# Patient Record
Sex: Male | Born: 1970 | Race: Asian | Hispanic: No | Marital: Single | Smoking: Former smoker
Health system: Southern US, Community
[De-identification: ages and names within clinical notes are randomized; demographics above are authoritative.]

## PROBLEM LIST (undated history)

## (undated) DIAGNOSIS — H9192 Unspecified hearing loss, left ear: Secondary | ICD-10-CM

## (undated) HISTORY — PX: NO PAST SURGERIES: SHX2092

---

## 2002-04-12 ENCOUNTER — Encounter: Payer: Self-pay | Admitting: Specialist

## 2002-04-12 ENCOUNTER — Encounter: Admission: RE | Admit: 2002-04-12 | Discharge: 2002-04-12 | Payer: Self-pay | Admitting: Specialist

## 2007-02-09 ENCOUNTER — Emergency Department (HOSPITAL_COMMUNITY): Admission: EM | Admit: 2007-02-09 | Discharge: 2007-02-09 | Payer: Self-pay | Admitting: Emergency Medicine

## 2011-06-19 ENCOUNTER — Ambulatory Visit: Payer: Self-pay | Admitting: Family Medicine

## 2011-06-19 VITALS — BP 133/90 | HR 89 | Temp 98.5°F | Resp 16 | Ht 62.0 in | Wt 125.8 lb

## 2011-06-19 DIAGNOSIS — H66009 Acute suppurative otitis media without spontaneous rupture of ear drum, unspecified ear: Secondary | ICD-10-CM

## 2011-06-19 DIAGNOSIS — H6690 Otitis media, unspecified, unspecified ear: Secondary | ICD-10-CM

## 2011-06-19 MED ORDER — AMOXICILLIN 500 MG PO CAPS: 1000.0000 mg | ORAL_CAPSULE | Freq: Two times a day (BID) | ORAL | Status: AC

## 2011-06-19 NOTE — Progress Notes (Signed)
  Patient Name: Joshua Goodman Date of Birth: 1970/09/09 Medical Record Number: [id number] Gender: male Date of Encounter: 06/19/2011  History of Present Illness:  Joshua Goodman is a 41 y.o. very pleasant male patient who presents with the following:  Here to evaluate left ear pain which has been intermittent for 10 YEARS.  He has noted "water" draining from the ear since last night.  He was told that his left TM ruptured several years ago but as far as he knows it has healed.  His hearing is decreased on the left.    otherwise he is feeling well and has no other symptoms.  He has not seen an ENT to evaluate his ear problem.  There is no problem list on file for this patient.  No past medical history on file. No past surgical history on file. History  Substance Use Topics  . Smoking status: Current Some Day Smoker  . Smokeless tobacco: Not on file  . Alcohol Use: Not on file   No family history on file. No Known Allergies  Medication list has been reviewed and updated.  Review of Systems: As per HPI- otherwise negative.   Physical Examination: Filed Vitals:   06/19/11 1325  BP: 133/90  Pulse: 89  Temp: 98.5 F (36.9 C)  Resp: 16  Height: 5\' 2"  (1.575 m)  Weight: 125 lb 12.8 oz (57.063 kg)    Body mass index is 23.01 kg/(m^2).  GEN: WDWN, NAD, Non-toxic, A & O x 3 HEENT: Atraumatic, Normocephalic. Neck supple. No masses, No LAD.  Right IAC/ TM wnl.  Left TM has a large hole, and the ear canal appears red but there is no swelling or debris as usually seen with OE.   Oropharynx wnl.  Ears and Nose: No external deformity. CV: RRR, No M/G/R. No JVD. No thrill. No extra heart sounds. PULM: CTA B, no wheezes, crackles, rhonchi. No retractions. No resp. distress. No accessory muscle use. EXTR: No c/c/e NEURO Normal gait.  PSYCH: Normally interactive. Conversant. Not depressed or anxious appearing.  Calm demeanor.    Assessment and Plan: 1. OM (otitis media), recurrent   amoxicillin (AMOXIL) 500 MG capsule   Suspect that he is having OM, as the ear canal is red but otherwise does not have a typical OE appearance.  However, if he is not feeling better within a couple of days he can call and I will call in floxin otic.  He is interested in getting his TM repaired and I gave him the name and phone number of several local ENT offices.

## 2011-07-04 ENCOUNTER — Telehealth: Payer: Self-pay

## 2011-07-04 DIAGNOSIS — H6060 Unspecified chronic otitis externa, unspecified ear: Secondary | ICD-10-CM

## 2011-07-04 NOTE — Telephone Encounter (Signed)
Pt would like something else for his ear infection she says he is not better

## 2011-07-16 ENCOUNTER — Telehealth: Payer: Self-pay | Admitting: *Deleted

## 2011-07-16 MED ORDER — OFLOXACIN 0.3 % OT SOLN: 10.0000 [drp] | Freq: Every day | OTIC | Status: AC

## 2011-07-16 NOTE — Telephone Encounter (Signed)
Pt states that the ear medication that Dr.Copland prescribed is not working pt would like something else prescribed if possible, has been waiting for several days for a call back.

## 2011-07-18 NOTE — Telephone Encounter (Signed)
Please call and see how his ear drops are doing (he was rx floxin otic a couple of days ago).  Thanks!

## 2011-07-18 NOTE — Telephone Encounter (Signed)
LMOM on Cell phone that we are checking on him and how the new otic sol is working for his Sxs. Asked for CB if no improvement or if he has any ?s/concerns

## 2013-06-03 ENCOUNTER — Ambulatory Visit: Payer: 59 | Admitting: Internal Medicine

## 2013-06-03 ENCOUNTER — Other Ambulatory Visit: Payer: Self-pay | Admitting: Internal Medicine

## 2013-06-03 ENCOUNTER — Ambulatory Visit: Payer: 59

## 2013-06-03 VITALS — BP 124/86 | HR 78 | Temp 98.4°F | Resp 16 | Ht 62.75 in | Wt 127.0 lb

## 2013-06-03 DIAGNOSIS — M79671 Pain in right foot: Secondary | ICD-10-CM

## 2013-06-03 DIAGNOSIS — M109 Gout, unspecified: Secondary | ICD-10-CM

## 2013-06-03 DIAGNOSIS — M79609 Pain in unspecified limb: Secondary | ICD-10-CM

## 2013-06-03 LAB — POCT CBC
Granulocyte percent: 68.2 %G (ref 37–80)
HCT, POC: 46.8 % (ref 43.5–53.7)
Hemoglobin: 15.1 g/dL (ref 14.1–18.1)
Lymph, poc: 1.8 (ref 0.6–3.4)
MCH, POC: 27.7 pg (ref 27–31.2)
MCHC: 32.3 g/dL (ref 31.8–35.4)
MCV: 85.8 fL (ref 80–97)
MID (cbc): 0.4 (ref 0–0.9)
MPV: 8 fL (ref 0–99.8)
POC Granulocyte: 4.8 (ref 2–6.9)
POC LYMPH PERCENT: 26.2 %L (ref 10–50)
POC MID %: 5.6 %M (ref 0–12)
Platelet Count, POC: 291 10*3/uL (ref 142–424)
RBC: 5.46 M/uL (ref 4.69–6.13)
RDW, POC: 12.9 %
WBC: 7 10*3/uL (ref 4.6–10.2)

## 2013-06-03 LAB — URIC ACID: Uric Acid, Serum: 7 mg/dL (ref 4.0–7.8)

## 2013-06-03 MED ORDER — INDOMETHACIN 50 MG PO CAPS: 50.0000 mg | ORAL_CAPSULE | Freq: Three times a day (TID) | ORAL | Status: DC

## 2013-06-03 NOTE — Progress Notes (Signed)
   Subjective:    Patient ID: Joshua NormanAjen Sellin, male    DOB: Jun 06, 1970, 43 y.o.   MRN: [id number]  HPI 43 year old male complains of left foot pain. He woke up Sunday morning and it has been swollen and painful since then. Has had same pain, reddness, swelling 1st mtpj left before. Does not speak English clearly. No fever, no health insurance.    Review of Systems Probable past hx of gout, has gone to providers out of our net work.     Objective:   Physical Exam  Constitutional: He is oriented to person, place, and time. He appears well-developed and well-nourished.  HENT:  Head: Normocephalic.  Eyes: EOM are normal.  Pulmonary/Chest: Effort normal.  Musculoskeletal: He exhibits edema and tenderness.       Left foot: He exhibits decreased range of motion, tenderness, bony tenderness, swelling and crepitus. He exhibits normal capillary refill, no deformity and no laceration.       Feet:  Red warm and tender  No wound Skin intact  Neurological: He is alert and oriented to person, place, and time. He exhibits normal muscle tone. Coordination normal.  Psychiatric: He has a normal mood and affect.   [facility] reading (PRIMARY) by  Dr.Abyan Cadman early punched out lesions medial joint space  No results found for this or any previous visit.  Results for orders placed in visit on 06/03/13  POCT CBC      Result Value Ref Range   WBC 7.0  4.6 - 10.2 K/uL   Lymph, poc 1.8  0.6 - 3.4   POC LYMPH PERCENT 26.2  10 - 50 %L   MID (cbc) 0.4  0 - 0.9   POC MID % 5.6  0 - 12 %M   POC Granulocyte 4.8  2 - 6.9   Granulocyte percent 68.2  37 - 80 %G   RBC 5.46  4.69 - 6.13 M/uL   Hemoglobin 15.1  14.1 - 18.1 g/dL   HCT, POC 24.446.8  01.043.5 - 53.7 %   MCV 85.8  80 - 97 fL   MCH, POC 27.7  27 - 31.2 pg   MCHC 32.3  31.8 - 35.4 g/dL   RDW, POC 27.212.9     Platelet Count, POC 291  142 - 424 K/uL   MPV 8.0  0 - 99.8 fL           Assessment & Plan:  Probable gout Post op shoe/Indocin 50mg  tid RTC 1 week  if not well

## 2013-06-03 NOTE — Patient Instructions (Signed)
Gout Gout is an inflammatory arthritis caused by a buildup of uric acid crystals in the joints. Uric acid is a chemical that is normally present in the blood. When the level of uric acid in the blood is too high it can form crystals that deposit in your joints and tissues. This causes joint redness, soreness, and swelling (inflammation). Repeat attacks are common. Over time, uric acid crystals can form into masses (tophi) near a joint, destroying bone and causing disfigurement. Gout is treatable and often preventable. CAUSES  The disease begins with elevated levels of uric acid in the blood. Uric acid is produced by your body when it breaks down a naturally found substance called purines. Certain foods you eat, such as meats and fish, contain high amounts of purines. Causes of an elevated uric acid level include:  Being passed down from parent to child (heredity).  Diseases that cause increased uric acid production (such as obesity, psoriasis, and certain cancers).  Excessive alcohol use.  Diet, especially diets rich in meat and seafood.  Medicines, including certain cancer-fighting medicines (chemotherapy), water pills (diuretics), and aspirin.  Chronic kidney disease. The kidneys are no longer able to remove uric acid well.  Problems with metabolism. Conditions strongly associated with gout include:  Obesity.  High blood pressure.  High cholesterol.  Diabetes. Not everyone with elevated uric acid levels gets gout. It is not understood why some people get gout and others do not. Surgery, joint injury, and eating too much of certain foods are some of the factors that can lead to gout attacks. SYMPTOMS   An attack of gout comes on quickly. It causes intense pain with redness, swelling, and warmth in a joint.  Fever can occur.  Often, only one joint is involved. Certain joints are more commonly involved:  Base of the big toe.  Knee.  Ankle.  Wrist.  Finger. Without  treatment, an attack usually goes away in a few days to weeks. Between attacks, you usually will not have symptoms, which is different from many other forms of arthritis. DIAGNOSIS  Your caregiver will suspect gout based on your symptoms and exam. In some cases, tests may be recommended. The tests may include:  Blood tests.  Urine tests.  X-rays.  Joint fluid exam. This exam requires a needle to remove fluid from the joint (arthrocentesis). Using a microscope, gout is confirmed when uric acid crystals are seen in the joint fluid. TREATMENT  There are two phases to gout treatment: treating the sudden onset (acute) attack and preventing attacks (prophylaxis).  Treatment of an Acute Attack.  Medicines are used. These include anti-inflammatory medicines or steroid medicines.  An injection of steroid medicine into the affected joint is sometimes necessary.  The painful joint is rested. Movement can worsen the arthritis.  You may use warm or cold treatments on painful joints, depending which works best for you.  Treatment to Prevent Attacks.  If you suffer from frequent gout attacks, your caregiver may advise preventive medicine. These medicines are started after the acute attack subsides. These medicines either help your kidneys eliminate uric acid from your body or decrease your uric acid production. You may need to stay on these medicines for a very long time.  The early phase of treatment with preventive medicine can be associated with an increase in acute gout attacks. For this reason, during the first few months of treatment, your caregiver may also advise you to take medicines usually used for acute gout treatment. Be sure you   understand your caregiver's directions. Your caregiver may make several adjustments to your medicine dose before these medicines are effective.  Discuss dietary treatment with your caregiver or dietitian. Alcohol and drinks high in sugar and fructose and foods  such as meat, poultry, and seafood can increase uric acid levels. Your caregiver or dietician can advise you on drinks and foods that should be limited. HOME CARE INSTRUCTIONS   Do not take aspirin to relieve pain. This raises uric acid levels.  Only take over-the-counter or prescription medicines for pain, discomfort, or fever as directed by your caregiver.  Rest the joint as much as possible. When in bed, keep sheets and blankets off painful areas.  Keep the affected joint raised (elevated).  Apply warm or cold treatments to painful joints. Use of warm or cold treatments depends on which works best for you.  Use crutches if the painful joint is in your leg.  Drink enough fluids to keep your urine clear or pale yellow. This helps your body get rid of uric acid. Limit alcohol, sugary drinks, and fructose drinks.  Follow your dietary instructions. Pay careful attention to the amount of protein you eat. Your daily diet should emphasize fruits, vegetables, whole grains, and fat-free or low-fat milk products. Discuss the use of coffee, vitamin C, and cherries with your caregiver or dietician. These may be helpful in lowering uric acid levels.  Maintain a healthy body weight. SEEK MEDICAL CARE IF:   You develop diarrhea, vomiting, or any side effects from medicines.  You do not feel better in 24 hours, or you are getting worse. SEEK IMMEDIATE MEDICAL CARE IF:   Your joint becomes suddenly more tender, and you have chills or a fever. MAKE SURE YOU:   Understand these instructions.  Will watch your condition.  Will get help right away if you are not doing well or get worse. Document Released: 01/19/2000 Document Revised: 05/18/2012 Document Reviewed: 09/04/2011 [facility] Patient Information 2014 [place]ExitCare, MarylandLLC. B?nh gt (Gout) Gt l tnh tr?ng vim kh?p gy ra b?i s? tch t? tinh th? axit uric trong kh?p. Axit uric l m?t ch?t ha h?c th??ng hi?n di?n trong mu. Khi n?ng ?? axit uric  trong mu qu cao, n c th? t?o [place] tin[place] th? tch t? trong kh?p v m c?a b?n. ?i?u ny gy ra t?y ??, ?au nh?c v s?ng kh?p (vim). Cc c?n gt l?p l?i l ph? bi?n. Theo th?i gian, cc tinh th? axit uric c th? t?o thnh cc c?c (h?t tophi) g?n kh?p, ph h?y x??ng v gy bi?n d?ng. Gt c th? ?i?u tr? ???c v th??ng c th? ng?n ng?a ???c.  NGUYN NHN C?n b?nh ny b?t ??u v?i n?ng ?? axit uric trong mu t?ng. Axit uric ???c t?o ra b?i c? th? khi ph v? m?t ch?t ???c tm th?y trong t? nhin c tn l purin. M?t s? lo?i th?c ph?m nh?t ??nh m b?n ?n, ch?ng h?n nh? th?t v c, c ch?a m?t l??ng l?n ch?t purin. Nguyn nhn lm cho n?ng ?? axit uric cao bao g?m:  Truy?n t? cha m? sang con (di truy?n).  B?nh lm t?ng s?n sinh axit uric (ch?ng h?n nh? bo ph, b?nh v?y n?n v m?t s? b?nh ung th? nh?t ??nh).  S? d?ng r??u qu m?c.  Ch? ?? ?n, ??c bi?t l ch? ?? ?n nhi?u th?t v h?i s?n.  Thu?c, bao g?m m?t s? lo?i thu?c ch?ng ung th? nh?t ??nh (ha tr? li?u), thu?c l?i ti?u v  aspirin.  B?nh th?n m?n tnh. Th?n khng th? lo?i b? h?t axit uric ???c n?a.  V?n ?? v? chuy?n ha. Cc tnh tr?ng c lin quan ch?t ch? v?i b?nh gt bao g?m:  Bo ph.  Huy?t p cao.  Cholesterol cao.  Ti?u ???ng. Khng ph?i t?t c? m?i ng??i c n?ng ?? axit uric cao ??u b? b?nh gt. Khng th? gi?i thch v sao m?t s? ng??i b? gt cn nh?ng ng??i khc l?i khng b?. Ph?u thu?t, ch?n th??ng kh?p v ?n qu nhi?u m?t s? lo?i th?c ph?m nh?t ??nh l m?t s? y?u t? c th? d?n ??n c?n gt c?p. TRI?U CH?NG  C?n gt c?p xu?t hi?n nhanh chng. B?nh gy ?au ??n d? d?i km theo t?y ??, s?ng v ?m ? m?t kh?p.  C th? b? s?t.  Thng th??ng, ch? l m?t kh?p b? ?au. M?t s? kh?p nh?t ??nh th??ng b? ?au:  N?n ngn chn ci.  ??u g?i.  C? chn.  C? tay.  Ngn tay. N?u khng ?i?u tr?, c?n gt c?p th??ng bi?n m?t trong m?t vi ngy ??n vi tu?n. Gi?a cc c?n gt c?p, b?n th??ng s? khng c cc tri?u ch?ng, cc tri?u ch?ng  th??ng khc v?i cc d?ng vim kh?p khc. CH?N ?ON Chuyn gia ch?m [place] s?c kh?e s? nghi ng? b?nh gt d?a trn cc tri?u ch?ng v vi?c khm. Trong m?t s? tr??ng h?p, b?n c th? c?n xt nghi?m. Cc xt nghi?m c th? bao g?m:  Xt nghi?m mu.  Xt nghi?m n??c ti?u.  Ch?p X quang.  Xt nghi?m d?ch kh?p. Xt nghi?m ny c?n m?t cy kim ?? ht d?ch ra kh?i kh?p (ch?c kh?p). B?ng cch s? d?ng knh hi?n vi, b?nh gt ???c xc nh?n khi tinh th? axit uric ???c pht hi?n trong d?ch kh?p. ?I?U TR? C hai giai ?o?n ?i?u tr? b?nh gt: ?i?u tr? c?n gt c?p kh?i pht ??t ng?t (c?p tnh) v ng?n ch?n c?n gt c?p (d? phng).  ?i?u Tr? C?n Gt C?p.  Thu?c ???c s? d?ng. Cc lo?i thu?c ny bao g?m thu?c ch?ng vim ho?c thu?c steroid.  ?i khi c?n tim thu?c steroid vo kh?p b? ?au.  Kh?p ?au ???c ngh? ng?i. V?n ??ng c th? lm tr?m tr?ng thm b?nh vim kh?p.  B?n c th? s? d?ng ph??ng php ?i?u tr? ?m ho?c l?nh trn kh?p b? ?au, ty thu?c ph??ng php no ph h?p nh?t v?i b?n.  ?i?u Tr? ?? Ng?n Ch?n Cc C?n Gt C?p.  N?u b?n th??ng xuyn b? cc c?n gt c?p, chuyn gia ch?m [place] s?c kh?e c th? t? v?n cho b?n s? d?ng thu?c d? phng. Nh?ng thu?c ny ???c b?t ??u sau khi c?n gt c?p ? ??. Nh?ng lo?i thu?c ny c gip th?n lo?i b? axit uric ra kh?i c? th? c?a b?n ho?c gi?m s?n xu?t axit uric. B?n c th? c?n ti?p t?c s? d?ng cc lo?i thu?c ny trong m?t th?i gian r?t di.  Giai ?o?n ?i?u tr? ban ??u b?ng thu?c phng ng?a c th? k?t h?p v?i s? gia t?ng cc c?n gt c?p. V l do ny, trong nh?ng thng ??u ?i?u tr?, chuyn gia ch?m [place] s?c kh?e c?ng c th? t? v?n cho b?n dng cc lo?i thu?c th??ng ???c s? d?ng ?? ?i?u tr? b?nh gt c?p tnh. ??m b?o b?n hi?u h??ng d?n c?a chuyn gia ch?m [place] s?c kh?e. Chuyn gia ch?m  s?c kh?e c th? th?c hi?n m?t s? ?i?u ch?nh li?u thu?c  c?a b?n tr??c khi nh?ng thu?c ny c hi?u qu?Marland Kitchen.  Th?o lu?n v? ?i?u tr? b?ng ch? ?? ?n u?ng v?i chuyn gia ch?m [place] s?c kh?e ho?c chuyn gia dinh  d??ng c?a b?n. R??u v ?? u?ng ch?a nhi?u ???ng v fructoza v cc th?c ph?m nh? th?t, gia c?m v h?i s?n c th? lm t?ng n?ng ?? axit uric. Chuyn gia ch?m [place] s?c kh?e ho?c chuyn gia dinh d??ng c?a b?n c th? t? v?n cho b?n v? ?? u?ng v th?c ph?m no c?n ph?i h?n ch?. H??NG D?N CH?M [place] T?I NH  Khng s? d?ng aspirin ?? gi?m ?au. Aspirin lm t?ng n?ng ?? axit uric.  Ch? s? d?ng thu?c khng c?n k toa ho?c thu?c c?n k toa ?? gi?m ?au, gi?m c?m gic kh ch?u ho?c h? s?t theo ch? d?n c?a chuyn gia ch?m [place] s?c kh?e c?a b?n.  ?? kh?p ? ngh? ng?i cng nhi?u cng t?t. Khi ? trn gi??ng, gi? cho kh?n tr?i gi??ng v ch?n trnh xa nh?ng ch? b? ?au.  Gi? cho kh?p b? ?au ? trn cao (gi? cao).  Ch??m ?m ho?c l?nh vo kh?p b? ?au. S? d?ng ph??ng php ?i?u tr? ?m ho?c l?nh ty thu?c vo ph??ng php no ph h?p nh?t v?i b?n.  S? d?ng n?ng n?u kh?p b? ?au ? chn b?n.  U?ng ?? n??c ?? gi? cho n??c ti?u trong ho?c vng nh?t. ?i?u ny gip c? th? b?n lo?i b? axit uric. H?n ch? r??u, ?? u?ng c ???ng v ?? u?ng c fructoza.  Th?c hi?n theo h??ng d?n v? ch? ?? ?n u?ng. Hy ch  c?n th?n ??n l??ng prtein b?n ?n. Ch? ?? ?n u?ng hng ngy c?a b?n nn t?p trung vo tri cy, rau, ng? c?c nguyn h?t v nh?ng s?n ph?m s?a khng c ch?t bo ho?c t ch?t bo. Th?o lu?n v? vi?c s? d?ng c ph, vitamin C v anh ?o v?i chuyn gia ch?m [place] s?c kh?e v chuyn gia dinh d??ng c?a b?n. Chng c th? gip lm gi?m n?ng ?? axit uric.  Duy tr tr?ng l??ng c? th? kh?e m?nh. HY ?I KHM N?U:  B?n b? tiu ch?y, nn m?a ho?c b?t k? tc d?ng ph? no t? thu?c.  B?n khng c?m th?y kh h?n sau 24 gi?, ho?c b?n th?y t? h?n. HY NGAY L?P T?C ?I KHM N?U:  Kh?p c?a b?n ??t nhin tr? nn nh?y c?m ?au h?n v b?n b? ?n l?nh ho?c s?t. ??M B?O B?N:  Hi?u cc h??ng d?n ny.  S? theo di tnh tr?ng c?a mnh.  S? yu c?u tr? gip ngay l?p t?c n?u b?n c?m th?y khng ?? ho?c tnh tr?ng tr?m tr?ng h?n. Document Released: 10/31/2004  Document Revised: 09/23/2012 [facility]Exit[facility] Patient Information 2014 [place] RidgeExitCare, MarylandLLC.

## 2013-06-07 ENCOUNTER — Encounter: Payer: Self-pay | Admitting: *Deleted

## 2013-11-09 ENCOUNTER — Other Ambulatory Visit: Payer: Self-pay | Admitting: Otolaryngology

## 2013-11-16 ENCOUNTER — Ambulatory Visit (INDEPENDENT_AMBULATORY_CARE_PROVIDER_SITE_OTHER): Payer: 59 | Admitting: Family Medicine

## 2013-11-16 VITALS — BP 136/80 | HR 84 | Temp 98.5°F | Resp 16 | Ht 62.5 in | Wt 129.0 lb

## 2013-11-16 DIAGNOSIS — H9192 Unspecified hearing loss, left ear: Secondary | ICD-10-CM

## 2013-11-16 DIAGNOSIS — H9202 Otalgia, left ear: Secondary | ICD-10-CM

## 2013-11-16 DIAGNOSIS — H65492 Other chronic nonsuppurative otitis media, left ear: Secondary | ICD-10-CM

## 2013-11-16 MED ORDER — AMOXICILLIN 875 MG PO TABS: 875.0000 mg | ORAL_TABLET | Freq: Two times a day (BID) | ORAL | Status: DC

## 2013-11-16 NOTE — Progress Notes (Signed)
This chart was scribed for Elvina SidleKurt Paizley Ramella, MD by Marica OtterNusrat Rahman, ED Scribe at [facility]. This patient was seen in room Room 8 and the patient's care was started at 10:31 AM.  Patient ID: Joshua Goodman MRN: [id number], DOB: [birth year], 43 y.o. Date of Encounter: 11/16/2013, 10:31 AM  Primary Physician: Lucilla EdinAUB, STEVE A, MD  Chief Complaint  Patient presents with  . Otalgia    left ear pain for awhile; clear drainage coming from ear  . Referral    pt would like to have a referral to [facility]   HPI: 43 y.o. year old male with history below presents with left ear pain with clear drainage onset onset 5 days ago. Pt has a Hx of recurrent ear problems. Pain is associated with hearing loss but no dizziness. Pt reports he was previously seen by ENT and wants a referral to see the ENT again.    History reviewed. No pertinent past medical history.   Home Meds: Prior to Admission medications   Not on File    Allergies: No Known Allergies  History   Social History  . Marital Status: Single    Spouse Name: N/A    Number of Children: N/A  . Years of Education: N/A   Occupational History  . Not on file.   Social History Main Topics  . Smoking status: Former Games developermoker  . Smokeless tobacco: Not on file  . Alcohol Use: Yes     Comment: 2 times yearly  . Drug Use: No  . Sexual Activity: Not on file   Other Topics Concern  . Not on file   Social History Narrative  . No narrative on file     Review of Systems: Constitutional: negative for chills, fever, night sweats, weight changes, or fatigue  HEENT: negative for vision changes, hearing loss, congestion, rhinorrhea, ST, epistaxis, or sinus pressure. Positive for L ear pain and associated clear drainage. Cardiovascular: negative for chest pain or palpitations Respiratory: negative for hemoptysis, wheezing, shortness of breath, or cough Abdominal: negative for abdominal pain, nausea, vomiting, diarrhea, or  constipation Dermatological: negative for rash Neurologic: negative for headache, dizziness, or syncope All other systems reviewed and are otherwise negative with the exception to those above and in the HPI.   Physical Exam: Blood pressure 136/80, pulse 84, temperature 98.5 F (36.9 C), resp. rate 16, height 5' 2.5" (1.588 m), weight 129 lb (58.514 kg), SpO2 97.00%., Body mass index is 23.2 kg/(m^2). General: Well developed, well nourished, in no acute distress. Head: Normocephalic, atraumatic, eyes without discharge, sclera non-icteric, nares are without discharge. Right auditory canal: clear, TM's are without perforation, pearly grey and translucent with reflective cone of light bilaterally.  Left ear drum is retracted with amber colored fluid. Oral cavity moist, posterior pharynx without exudate, erythema, peritonsillar abscess, or post nasal drip. Neck: Supple. No thyromegaly. Full ROM. No lymphadenopathy. Lungs: Clear bilaterally to auscultation without wheezes, rales, or rhonchi. Breathing is unlabored. Heart: RRR with S1 S2. No murmurs, rubs, or gallops appreciated. Abdomen: Soft, non-tender, non-distended with normoactive bowel sounds. No hepatomegaly. No rebound/guarding. No obvious abdominal masses. Msk:  Strength and tone normal for age. Extremities/Skin: Warm and dry. No clubbing or cyanosis. No edema. No rashes or suspicious lesions. Neuro: Alert and oriented X 3. Moves all extremities spontaneously. Gait is normal. CNII-XII grossly in tact. Psych:  Responds to questions appropriately with a normal affect.        ASSESSMENT AND PLAN:  DIAGNOSTIC  STUDIES: Oxygen Saturation is 97% on RA, nl by my interpretation.    COORDINATION OF CARE: 10:35 AM-Discussed treatment plan which includes ENT referral and amoxacillin with pt at bedside and pt agreed to plan.   43 y.o. 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 The recorded information has been reviewed and is accurate.

## 2013-11-18 ENCOUNTER — Encounter (HOSPITAL_BASED_OUTPATIENT_CLINIC_OR_DEPARTMENT_OTHER): Payer: Self-pay | Admitting: *Deleted

## 2013-11-18 NOTE — Progress Notes (Signed)
Pt speaks very good english-no pcp-never been sedated

## 2013-11-22 ENCOUNTER — Encounter (HOSPITAL_BASED_OUTPATIENT_CLINIC_OR_DEPARTMENT_OTHER): Payer: Self-pay

## 2013-11-22 ENCOUNTER — Ambulatory Visit (HOSPITAL_BASED_OUTPATIENT_CLINIC_OR_DEPARTMENT_OTHER): Payer: 59 | Admitting: Anesthesiology

## 2013-11-22 ENCOUNTER — Encounter (HOSPITAL_BASED_OUTPATIENT_CLINIC_OR_DEPARTMENT_OTHER)
Admission: RE | Disposition: A | Discharge status: Home / Self Care | Payer: Self-pay | Source: Ambulatory Visit | Attending: Otolaryngology

## 2013-11-22 ENCOUNTER — Ambulatory Visit (HOSPITAL_BASED_OUTPATIENT_CLINIC_OR_DEPARTMENT_OTHER)
Admission: RE | Admit: 2013-11-22 | Discharge: 2013-11-22 | Disposition: A | Discharge status: Home / Self Care | Payer: 59 | Source: Ambulatory Visit | Attending: Otolaryngology | Admitting: Otolaryngology

## 2013-11-22 ENCOUNTER — Encounter (HOSPITAL_BASED_OUTPATIENT_CLINIC_OR_DEPARTMENT_OTHER): Payer: 59 | Admitting: Anesthesiology

## 2013-11-22 DIAGNOSIS — H902 Conductive hearing loss, unspecified: Secondary | ICD-10-CM | POA: Diagnosis not present

## 2013-11-22 DIAGNOSIS — H7292 Unspecified perforation of tympanic membrane, left ear: Secondary | ICD-10-CM | POA: Insufficient documentation

## 2013-11-22 DIAGNOSIS — Z9889 Other specified postprocedural states: Secondary | ICD-10-CM

## 2013-11-22 HISTORY — PX: TYMPANOPLASTY: SHX33

## 2013-11-22 HISTORY — DX: Unspecified hearing loss, left ear: H91.92

## 2013-11-22 LAB — POCT HEMOGLOBIN-HEMACUE: HEMOGLOBIN: 16.7 g/dL (ref 13.0–17.0)

## 2013-11-22 SURGERY — TYMPANOPLASTY
Anesthesia: General | Site: Ear | Laterality: Left

## 2013-11-22 MED ORDER — MIDAZOLAM HCL 5 MG/5ML IJ SOLN
INTRAMUSCULAR | Status: DC | PRN
  Administered 2013-11-22: 2 mg via INTRAVENOUS

## 2013-11-22 MED ORDER — FENTANYL CITRATE 0.05 MG/ML IJ SOLN
INTRAMUSCULAR | Status: DC | PRN
  Administered 2013-11-22 (×2): 50 ug via INTRAVENOUS
  Administered 2013-11-22: 100 ug via INTRAVENOUS

## 2013-11-22 MED ORDER — CIPROFLOXACIN-DEXAMETHASONE 0.3-0.1 % OT SUSP
OTIC | Status: DC | PRN
  Administered 2013-11-22: 4 [drp] via OTIC

## 2013-11-22 MED ORDER — MIDAZOLAM HCL 2 MG/2ML IJ SOLN: 1.0000 mg | INTRAMUSCULAR | Status: DC | PRN

## 2013-11-22 MED ORDER — MIDAZOLAM HCL 2 MG/2ML IJ SOLN
INTRAMUSCULAR | Status: AC
  Filled 2013-11-22: qty 2

## 2013-11-22 MED ORDER — CEFAZOLIN SODIUM-DEXTROSE 2-3 GM-% IV SOLR
INTRAVENOUS | Status: DC | PRN
  Administered 2013-11-22: 2 g via INTRAVENOUS

## 2013-11-22 MED ORDER — LIDOCAINE-EPINEPHRINE 1 %-1:100000 IJ SOLN
INTRAMUSCULAR | Status: DC | PRN
  Administered 2013-11-22: 2 mL

## 2013-11-22 MED ORDER — FENTANYL CITRATE 0.05 MG/ML IJ SOLN: 50.0000 ug | INTRAMUSCULAR | Status: DC | PRN

## 2013-11-22 MED ORDER — CIPROFLOXACIN-DEXAMETHASONE 0.3-0.1 % OT SUSP
OTIC | Status: AC
  Filled 2013-11-22: qty 7.5

## 2013-11-22 MED ORDER — PROPOFOL 10 MG/ML IV BOLUS
INTRAVENOUS | Status: DC | PRN
  Administered 2013-11-22: 150 mg via INTRAVENOUS
  Administered 2013-11-22: 20 mg via INTRAVENOUS

## 2013-11-22 MED ORDER — DIPHENHYDRAMINE HCL 50 MG/ML IJ SOLN
12.5000 mg | Freq: Once | INTRAMUSCULAR | Status: AC
  Administered 2013-11-22: 12.5 mg via INTRAVENOUS

## 2013-11-22 MED ORDER — AMOXICILLIN 875 MG PO TABS: 875.0000 mg | ORAL_TABLET | Freq: Two times a day (BID) | ORAL | Status: DC

## 2013-11-22 MED ORDER — ONDANSETRON HCL 4 MG/2ML IJ SOLN
INTRAMUSCULAR | Status: DC | PRN
  Administered 2013-11-22: 4 mg via INTRAVENOUS

## 2013-11-22 MED ORDER — OXYCODONE-ACETAMINOPHEN 5-325 MG PO TABS: 1.0000 | ORAL_TABLET | ORAL | Status: DC | PRN

## 2013-11-22 MED ORDER — EPINEPHRINE HCL 1 MG/ML IJ SOLN
INTRAMUSCULAR | Status: AC
  Filled 2013-11-22: qty 1

## 2013-11-22 MED ORDER — MIDAZOLAM HCL 2 MG/2ML IJ SOLN: 0.5000 mg | Freq: Once | INTRAMUSCULAR | Status: DC | PRN

## 2013-11-22 MED ORDER — OXYCODONE HCL 5 MG PO TABS: 5.0000 mg | ORAL_TABLET | Freq: Once | ORAL | Status: DC | PRN

## 2013-11-22 MED ORDER — FENTANYL CITRATE 0.05 MG/ML IJ SOLN
INTRAMUSCULAR | Status: AC
  Filled 2013-11-22: qty 8

## 2013-11-22 MED ORDER — LACTATED RINGERS IV SOLN
INTRAVENOUS | Status: DC
  Administered 2013-11-22 (×2): via INTRAVENOUS

## 2013-11-22 MED ORDER — SUCCINYLCHOLINE CHLORIDE 20 MG/ML IJ SOLN
INTRAMUSCULAR | Status: DC | PRN
  Administered 2013-11-22: 100 mg via INTRAVENOUS

## 2013-11-22 MED ORDER — BACITRACIN ZINC 500 UNIT/GM EX OINT
TOPICAL_OINTMENT | CUTANEOUS | Status: DC | PRN
  Administered 2013-11-22: 1 via TOPICAL

## 2013-11-22 MED ORDER — HYDROMORPHONE HCL 1 MG/ML IJ SOLN: 0.2500 mg | INTRAMUSCULAR | Status: DC | PRN

## 2013-11-22 MED ORDER — DEXAMETHASONE SODIUM PHOSPHATE 4 MG/ML IJ SOLN
INTRAMUSCULAR | Status: DC | PRN
  Administered 2013-11-22: 10 mg via INTRAVENOUS

## 2013-11-22 MED ORDER — PROPOFOL 10 MG/ML IV BOLUS
INTRAVENOUS | Status: AC
  Filled 2013-11-22: qty 20

## 2013-11-22 MED ORDER — LIDOCAINE-EPINEPHRINE 1 %-1:100000 IJ SOLN
INTRAMUSCULAR | Status: AC
  Filled 2013-11-22: qty 1

## 2013-11-22 MED ORDER — OXYCODONE HCL 5 MG/5ML PO SOLN: 5.0000 mg | Freq: Once | ORAL | Status: DC | PRN

## 2013-11-22 MED ORDER — OXYMETAZOLINE HCL 0.05 % NA SOLN
NASAL | Status: AC
  Filled 2013-11-22: qty 15

## 2013-11-22 MED ORDER — MEPERIDINE HCL 25 MG/ML IJ SOLN: 6.2500 mg | INTRAMUSCULAR | Status: DC | PRN

## 2013-11-22 MED ORDER — BACITRACIN ZINC 500 UNIT/GM EX OINT
TOPICAL_OINTMENT | CUTANEOUS | Status: AC
  Filled 2013-11-22: qty 28.35

## 2013-11-22 MED ORDER — PROMETHAZINE HCL 25 MG/ML IJ SOLN: 6.2500 mg | INTRAMUSCULAR | Status: DC | PRN

## 2013-11-22 SURGICAL SUPPLY — 53 items
ADH SKN CLS APL DERMABOND .7 (GAUZE/BANDAGES/DRESSINGS) ×1
BALL CTTN LRG ABS STRL LF (GAUZE/BANDAGES/DRESSINGS) ×1
BLADE CLIPPER SURG (BLADE) ×2 IMPLANT
BLADE NDL 3 SS STRL (BLADE) IMPLANT
BLADE NEEDLE 3 SS STRL (BLADE) IMPLANT
BLADE NEEDLE 3MM SS STRL (BLADE)
CANISTER SUCT 1200ML W/VALVE (MISCELLANEOUS) ×3 IMPLANT
CORDS BIPOLAR (ELECTRODE) IMPLANT
COTTONBALL LRG STERILE PKG (GAUZE/BANDAGES/DRESSINGS) ×3 IMPLANT
DECANTER SPIKE VIAL GLASS SM (MISCELLANEOUS) ×1 IMPLANT
DERMABOND ADVANCED (GAUZE/BANDAGES/DRESSINGS) ×2
DERMABOND ADVANCED .7 DNX12 (GAUZE/BANDAGES/DRESSINGS) IMPLANT
DRAPE MICROSCOPE WILD 40.5X102 (DRAPES) ×3 IMPLANT
DRAPE SURG 17X23 STRL (DRAPES) ×3 IMPLANT
DRSG GLASSCOCK MASTOID ADT (GAUZE/BANDAGES/DRESSINGS) IMPLANT
DRSG GLASSCOCK MASTOID PED (GAUZE/BANDAGES/DRESSINGS) IMPLANT
ELECT COATED BLADE 2.86 ST (ELECTRODE) ×3 IMPLANT
ELECT REM PT RETURN 9FT ADLT (ELECTROSURGICAL) ×3
ELECTRODE REM PT RTRN 9FT ADLT (ELECTROSURGICAL) ×1 IMPLANT
GAUZE SPONGE 4X4 12PLY STRL (GAUZE/BANDAGES/DRESSINGS) IMPLANT
GLOVE BIO SURGEON STRL SZ7.5 (GLOVE) ×3 IMPLANT
GLOVE BIOGEL M 7.0 STRL (GLOVE) ×2 IMPLANT
GLOVE BIOGEL PI IND STRL 7.5 (GLOVE) IMPLANT
GLOVE BIOGEL PI INDICATOR 7.5 (GLOVE) ×2
GOWN STRL REUS W/ TWL LRG LVL3 (GOWN DISPOSABLE) ×2 IMPLANT
GOWN STRL REUS W/TWL LRG LVL3 (GOWN DISPOSABLE) ×6
IV CATH AUTO 14GX1.75 SAFE ORG (IV SOLUTION) ×3 IMPLANT
IV NS 500ML (IV SOLUTION)
IV NS 500ML BAXH (IV SOLUTION) IMPLANT
NDL HYPO 25X1 1.5 SAFETY (NEEDLE) ×1 IMPLANT
NDL SAFETY ECLIPSE 18X1.5 (NEEDLE) ×1 IMPLANT
NEEDLE HYPO 18GX1.5 SHARP (NEEDLE) ×3
NEEDLE HYPO 25X1 1.5 SAFETY (NEEDLE) ×3 IMPLANT
NS IRRIG 1000ML POUR BTL (IV SOLUTION) ×3 IMPLANT
PACK BASIN DAY SURGERY FS (CUSTOM PROCEDURE TRAY) ×3 IMPLANT
PACK ENT DAY SURGERY (CUSTOM PROCEDURE TRAY) ×3 IMPLANT
PENCIL BUTTON HOLSTER BLD 10FT (ELECTRODE) ×3 IMPLANT
SET EXT MALE ROTATING LL 32IN (MISCELLANEOUS) ×3 IMPLANT
SET IV EXT TUBING FEMALE 31 (MISCELLANEOUS) ×1 IMPLANT
SLEEVE SCD COMPRESS KNEE MED (MISCELLANEOUS) ×2 IMPLANT
SPONGE GAUZE 4X4 12PLY STER LF (GAUZE/BANDAGES/DRESSINGS) IMPLANT
SPONGE SURGIFOAM ABS GEL 12-7 (HEMOSTASIS) ×3 IMPLANT
SUT VIC AB 3-0 SH 27 (SUTURE)
SUT VIC AB 3-0 SH 27X BRD (SUTURE) IMPLANT
SUT VIC AB 4-0 P-3 18XBRD (SUTURE) IMPLANT
SUT VIC AB 4-0 P3 18 (SUTURE)
SUT VICRYL 4-0 PS2 18IN ABS (SUTURE) ×2 IMPLANT
SYR 3ML 18GX1 1/2 (SYRINGE) ×3 IMPLANT
SYR 5ML LL (SYRINGE) IMPLANT
SYR BULB 3OZ (MISCELLANEOUS) IMPLANT
TOWEL OR 17X24 6PK STRL BLUE (TOWEL DISPOSABLE) ×3 IMPLANT
TRAY DSU PREP LF (CUSTOM PROCEDURE TRAY) ×3 IMPLANT
TUBING IRRIGATION (MISCELLANEOUS) IMPLANT

## 2013-11-22 NOTE — H&P (Signed)
Cc: Left TM perforation  HPI: The patient is a 43 y/o male who returns today for follow up evaluation of his left TM perforation. He was last seen 6 months ago.  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 According to the patient, he is doing much better. No otalgia, otorrhea, or dizziness has been noted. He feels that his hearing has improved. No other ENT, GI, or respiratory issue noted since the last visit.   Exam: General: Communicates without difficulty, well nourished, no acute distress. Head: Normocephalic, no evidence injury, no tenderness, facial buttresses intact without stepoff. Eyes: PERRL, EOMI. No scleral icterus, conjunctivae clear. Neuro: CN II exam reveals vision grossly intact. No nystagmus at any point of gaze. Ears: Auricles well formed without lesions. Ear canals are intact without mass or lesion. No erythema or edema is appreciated. The right TM is intact. The left is noted to have a large perforation without erythema or drainage. Nose: External evaluation reveals normal support and skin without lesions. Dorsum is intact. Anterior rhinoscopy reveals healthy pink mucosa over anterior aspect of inferior turbinates and intact septum. No purulence noted. Oral:  Oral cavity and oropharynx are intact, symmetric, without erythema or edema. Mucosa is moist without lesions. Neck: Full range of motion without pain. There is no significant lymphadenopathy. No masses palpable. Thyroid bed within normal limits to palpation. Parotid glands and submandibular glands equal bilaterally without mass. Trachea is midline. Neuro:  CN 2-12 grossly intact. Gait normal. Vestibular: No nystagmus at any point of gaze. The cerebellar examination is unremarkable.  AUDIOMETRIC TESTING:  Shows normal hearing on the right across all frequencies with mild conductive hearing loss noted on the left. The speech  reception threshold is 15dB AD and 40dB AS. The discrimination score is 88% AD and 92% AS. The tympanogram normal on the right.  Assessment 1. 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 2. The right TM is intact with normal hearing noted.  3. Mild conductive hearing loss is noted on the left.   Plan 1. The physical exam findings and hearing test results are reviewed with the patient.  2. Treatment options include continuing conservative observation with dry ear precautions verses tympanoplasty. The risks, benefits, alternatives, and details of the procedure are reviewed with the patient. Questions are invited and answered. 3. The patient would like to proceed with the tympanoplasty procedure.

## 2013-11-22 NOTE — Anesthesia Postprocedure Evaluation (Signed)
  Anesthesia Post-op Note  Patient: Joshua Goodman  Procedure(s) Performed: Procedure(s): LEFT TYMPANOPLASTY (Left)  Patient Location: PACU  Anesthesia Type:General  Level of Consciousness: awake and alert   Airway and Oxygen Therapy: Patient Spontanous Breathing  Post-op Pain: none  Post-op Assessment: Post-op Vital signs reviewed, Patient's Cardiovascular Status Stable and Respiratory Function Stable  Post-op Vital Signs: Reviewed  Filed Vitals:   11/22/13 1345  BP: 115/79  Pulse: 79  Temp:   Resp: 18    Complications: No apparent anesthesia complications

## 2013-11-22 NOTE — Anesthesia Preprocedure Evaluation (Addendum)
Anesthesia Evaluation  Patient identified by MRN, date of birth, ID band Patient awake    Reviewed: Allergy & Precautions, H&P , NPO status , Patient's Chart, lab work & pertinent test results  History of Anesthesia Complications Negative for: history of anesthetic complications  Airway Mallampati: II TM Distance: >3 FB Neck ROM: Full    Dental  (+) Loose, Dental Advisory Given   Pulmonary former smoker,  breath sounds clear to auscultation        Cardiovascular negative cardio ROS  Rhythm:Regular Rate:Normal     Neuro/Psych negative neurological ROS     GI/Hepatic negative GI ROS, Neg liver ROS,   Endo/Other  negative endocrine ROS  Renal/GU negative Renal ROS     Musculoskeletal   Abdominal   Peds  Hematology negative hematology ROS (+)   Anesthesia Other Findings   Reproductive/Obstetrics                         Anesthesia Physical Anesthesia Plan  ASA: II  Anesthesia Plan: General   Post-op Pain Management:    Induction: Intravenous  Airway Management Planned: Oral ETT  Additional Equipment:   Intra-op Plan:   Post-operative Plan: Extubation in OR  Informed Consent: I have reviewed the patients History and Physical, chart, labs and discussed the procedure including the risks, benefits and alternatives for the proposed anesthesia with the patient or authorized representative who has indicated his/her understanding and acceptance.   Dental advisory given  Plan Discussed with: CRNA and Surgeon  Anesthesia Plan Comments: (Plan routine monitors, GETA)        Anesthesia Quick Evaluation

## 2013-11-22 NOTE — Anesthesia Procedure Notes (Signed)
Procedure Name: Intubation Date/Time: 11/22/2013 11:21 AM Performed by: Gar GibbonKEETON, Ugochi Henzler S Pre-anesthesia Checklist: Patient identified, Emergency Drugs available, Suction available and Patient being monitored Patient Re-evaluated:Patient Re-evaluated prior to inductionOxygen Delivery Method: Circle System Utilized Preoxygenation: Pre-oxygenation with 100% oxygen Intubation Type: IV induction Ventilation: Mask ventilation without difficulty Laryngoscope Size: Miller and 2 Grade View: Grade II Tube type: Oral Tube size: 8.0 mm Number of attempts: 1 Airway Equipment and Method: stylet and oral airway Placement Confirmation: ETT inserted through vocal cords under direct vision,  positive ETCO2 and breath sounds checked- equal and bilateral Secured at: 19 cm Tube secured with: Tape Dental Injury: Teeth and Oropharynx as per pre-operative assessment

## 2013-11-22 NOTE — Brief Op Note (Signed)
11/22/2013  12:32 PM  PATIENT:  Joshua Goodman  43 y.o. male  PRE-OPERATIVE DIAGNOSIS:  LET TYMPANIC MEMBRANE PERFORATION   POST-OPERATIVE DIAGNOSIS:  LET TYMPANIC MEMBRANE PERFORATION   PROCEDURE:  Procedure(s): 1. LEFT TRANSCANAL TYMPANOPLASTY  2. POST AURICULAR TEMPORALIS FASCIA GRAFT  SURGEON:  Surgeon(s) and Role:    * Sui W Ioanna Colquhoun, MD - Primary  PHYSICIAN ASSISTANT:   ASSISTANTS: none   ANESTHESIA:   general  EBL:  Total I/O In: 1000 [I.V.:1000] Out: -   BLOOD ADMINISTERED:none  DRAINS: none   LOCAL MEDICATIONS USED:  LIDOCAINE   SPECIMEN:  No Specimen  DISPOSITION OF SPECIMEN:  N/A  COUNTS:  YES  TOURNIQUET:  * No tourniquets in log *  DICTATION: .Other Dictation: Dictation Number  [id number]  PLAN OF CARE: Discharge to home after PACU  PATIENT DISPOSITION:  PACU - hemodynamically stable.   Delay start of Pharmacological VTE agent (>24hrs) due to surgical blood loss or risk of bleeding: not applicable

## 2013-11-22 NOTE — Transfer of Care (Signed)
Immediate Anesthesia Transfer of Care Note  Patient: Joshua Goodman  Procedure(s) Performed: Procedure(s): LEFT TYMPANOPLASTY (Left)  Patient Location: PACU  Anesthesia Type:General  Level of Consciousness: sedated and responds to stimulation  Airway & Oxygen Therapy: Patient Spontanous Breathing and Patient connected to face mask oxygen  Post-op Assessment: Report given to PACU RN and Post -op Vital signs reviewed and stable  Post vital signs: Reviewed and stable  Complications: No apparent anesthesia complications

## 2013-11-22 NOTE — Discharge Instructions (Addendum)
POSTOPERATIVE INSTRUCTIONS FOR PATIENTS HAVING A MYRINGOPLASTY AND TYMPANOPLASTY °1. Avoid undue fatigue or exposure to colds or upper respiratory infections if possible. °2. Do not blow your nose for approximately one week following surgery. Any accumulated secretions in the nose should be drawn back and expectorated through the mouth to avoid infecting the ear. If you sneeze, do so with your mouth open. Do not hold your nose to avoid sneezing. Do not play musical wind instruments for 3 weeks. °3. Wash your hands with soap and water before treating the ear. °4. A clean cloth moistened with warm water may be used to clean the outer ear as often as necessary for cleanliness and comfort. Do not allow water to enter the ear canal for at least three weeks. °5. You may shampoo your hair 48 hours following surgery, provided that water is not allowed to enter your ear canal. Water can be kept out of your ear canal by placing a cotton ball in the ear opening and applying Vaseline over the cotton to form a water tight seal. °6. If ear drops are to be instilled, position the head with the affected ear up during the instillation and remain in this position for five to ten minutes to facilitate the absorption of the drops. Then place a clean cotton ball in the ear for about an hour. °7. The ear should be exposed to the air as much as possible. A cotton ball should be placed in the ear canal during the day while combing the hair, during exposure to a dusty environment, and at night to prevent drainage onto your pillow. At first, the drainage may be red-brown to brown in color, but the brown drainage usually becomes clear and disappears within a week or two. If drainage increases, call our office, [phone]. °8. If your physician prescribes an antibiotic, fill the prescription promptly and take all of the medicine as directed until the entire supply is gone. °9. If any of the following should occur, contact your  physician: °a. Persistent bleeding °b. Persistent fever °c. Purulent drainage (pus) from the ear or incision °d. Increasing redness around the suture line °e. Persistent pain or dizziness °f. Facial weakness °g. Rash around the ear or incision °10. Do not be overly concerned about your hearing until at least one month postoperatively. Your hearing may fluctuate as the ear heals. You may also experience some popping and cracking sounds in the ear for up to several weeks. It may sound like you are “talking in a barrel” or a tunnel. This is normal and should not cause concern. °11. You may notice a metallic taste in your mouth for several weeks after ear surgery. The taste will usually go away spontaneously. °12. Please ask your surgeon if any of the middle ear ossicles were replaced with metal parts. This may be important to know if you ever need to have a magnetic resonance imaging scan (MRI) in the future. °13. It is important for you to return for your scheduled appointments. ° ° °Post Anesthesia Home Care Instructions ° °Activity: °Get plenty of rest for the remainder of the day. A responsible adult should stay with you for 24 hours following the procedure.  °For the next 24 hours, DO NOT: °-Drive a car °-Operate machinery °-Drink alcoholic beverages °-Take any medication unless instructed by your physician °-Make any legal decisions or sign important papers. ° °Meals: °Start with liquid foods such as gelatin or soup. Progress to regular foods as tolerated. Avoid greasy,   spicy, heavy foods. If nausea and/or vomiting occur, drink only clear liquids until the nausea and/or vomiting subsides. Call your physician if vomiting continues. ° °Special Instructions/Symptoms: °Your throat may feel dry or sore from the anesthesia or the breathing tube placed in your throat during surgery. If this causes discomfort, gargle with warm salt water. The discomfort should disappear within 24 hours. ° °

## 2013-11-23 ENCOUNTER — Encounter (HOSPITAL_BASED_OUTPATIENT_CLINIC_OR_DEPARTMENT_OTHER): Payer: Self-pay | Admitting: Otolaryngology

## 2013-11-23 NOTE — Op Note (Signed)
NAMIgnatius Specking:  Pirro, Binyomin                   ACCOUNT NO.:  [id number]  MEDICAL RECORD NO.:  [id number]  LOCATION:                                 FACILITY:  PHYSICIAN:  Newman PiesSu Raeshawn Vo, MD                 DATE OF BIRTH:  DATE OF PROCEDURE:  11/22/2013 DATE OF DISCHARGE:                              OPERATIVE REPORT   SURGEON:  Newman PiesSu Lavarius Doughten, MD  PREOPERATIVE DIAGNOSES: 1. Left tympanic membrane perforation. 2. Left ear conductive hearing loss.  POSTOPERATIVE DIAGNOSES: 1. Left tympanic membrane perforation. 2. Left ear conductive hearing loss.  PROCEDURE PERFORMED: 1. Left transcanal tympanoplasty. 2. Postauricular temporalis fascia graft harvesting.  ANESTHESIA:  General endotracheal tube anesthesia.  COMPLICATIONS:  None.  ESTIMATED BLOOD LOSS:  Minimal.  INDICATION FOR PROCEDURE:  The patient is a 43 year old male with a history of chronic left tympanic membrane perforation.  It resulted in left ear conductive hearing loss.  The patient was previously treated with topical antibiotic eardrops.  However, he continues to have the nonhealing TM perforation.  Based on the above findings, the decision was made for patient to undergo the tympanoplasty procedure.  The risks, benefits, alternatives, and details of the procedure were discussed with the patient.  Questions were invited and answered.  Informed consent was obtained.  DESCRIPTION:  The patient was taken to the operating room and placed supine on the operating table.  General endotracheal tube anesthesia was administered by the anesthesiologist.  Preop IV antibiotic was given. The patient was positioned and prepped and draped in the standard fashion for left ear surgery.  Under the operating microscope, the left ear canal was cleaned of all cerumen.  A large 40% inferior tympanic membrane perforation was noted. A rim of fibrotic tissue was removed circumferentially from the perforation.  A standard tympanomeatal flap was elevated in  a standard fashion.  No other middle ear pathology was noted.  The long process of the manubrium was noted to be partially eroded.  Attention was then focused on obtaining the temporalis fascia graft.  A separate postauricular incision was made.  The incision was carried down to the level of the temporalis fascia.  A 2 x 2 cm temporalis fascia graft was harvested in standard fashion.  The surgical site was copiously irrigated.  The incision was closed in layers with a 4-0 Vicryl and Dermabond.  Under the microscope, the temporalis fascia graft was used in underlay fashion to close the tympanic membrane perforation via the transcanal approach.  Gelfoam soaked with Ciprodex were used to fill the middle ear space.  Gelfoams were also used lateral to the neotympanum.  The rest of the ear canal was then filled with antibiotic ointment.  The care of the patient was turned over to the anesthesiologist.  The patient was awakened from anesthesia without difficulty.  He was extubated and transferred to the recovery room in good condition.  OPERATIVE FINDINGS:  A large 40% inferior left tympanic membrane perforation was noted.  SPECIMENS:  None.  FOLLOWUP CARE:  The patient will be discharged home once he is awake and alert.  He  will be given Percocet p.r.n. pain, and amoxicillin p.o. b.i.d. for 5 days.  The patient will follow up in my office in 1 week.     Newman PiesSu Florie Carico, MD     ST/MEDQ  D:  11/22/2013  T:  11/23/2013  Job:  [id number]

## 2014-11-08 ENCOUNTER — Encounter: Payer: Self-pay | Admitting: Emergency Medicine

## 2015-05-05 IMAGING — CR DG FOOT 2V*L*
2 series · 2 of 2 positions shown · non-contrast
Comparison: None.

CLINICAL DATA: Pain.

EXAM:
LEFT FOOT - 2 VIEW

[AP]
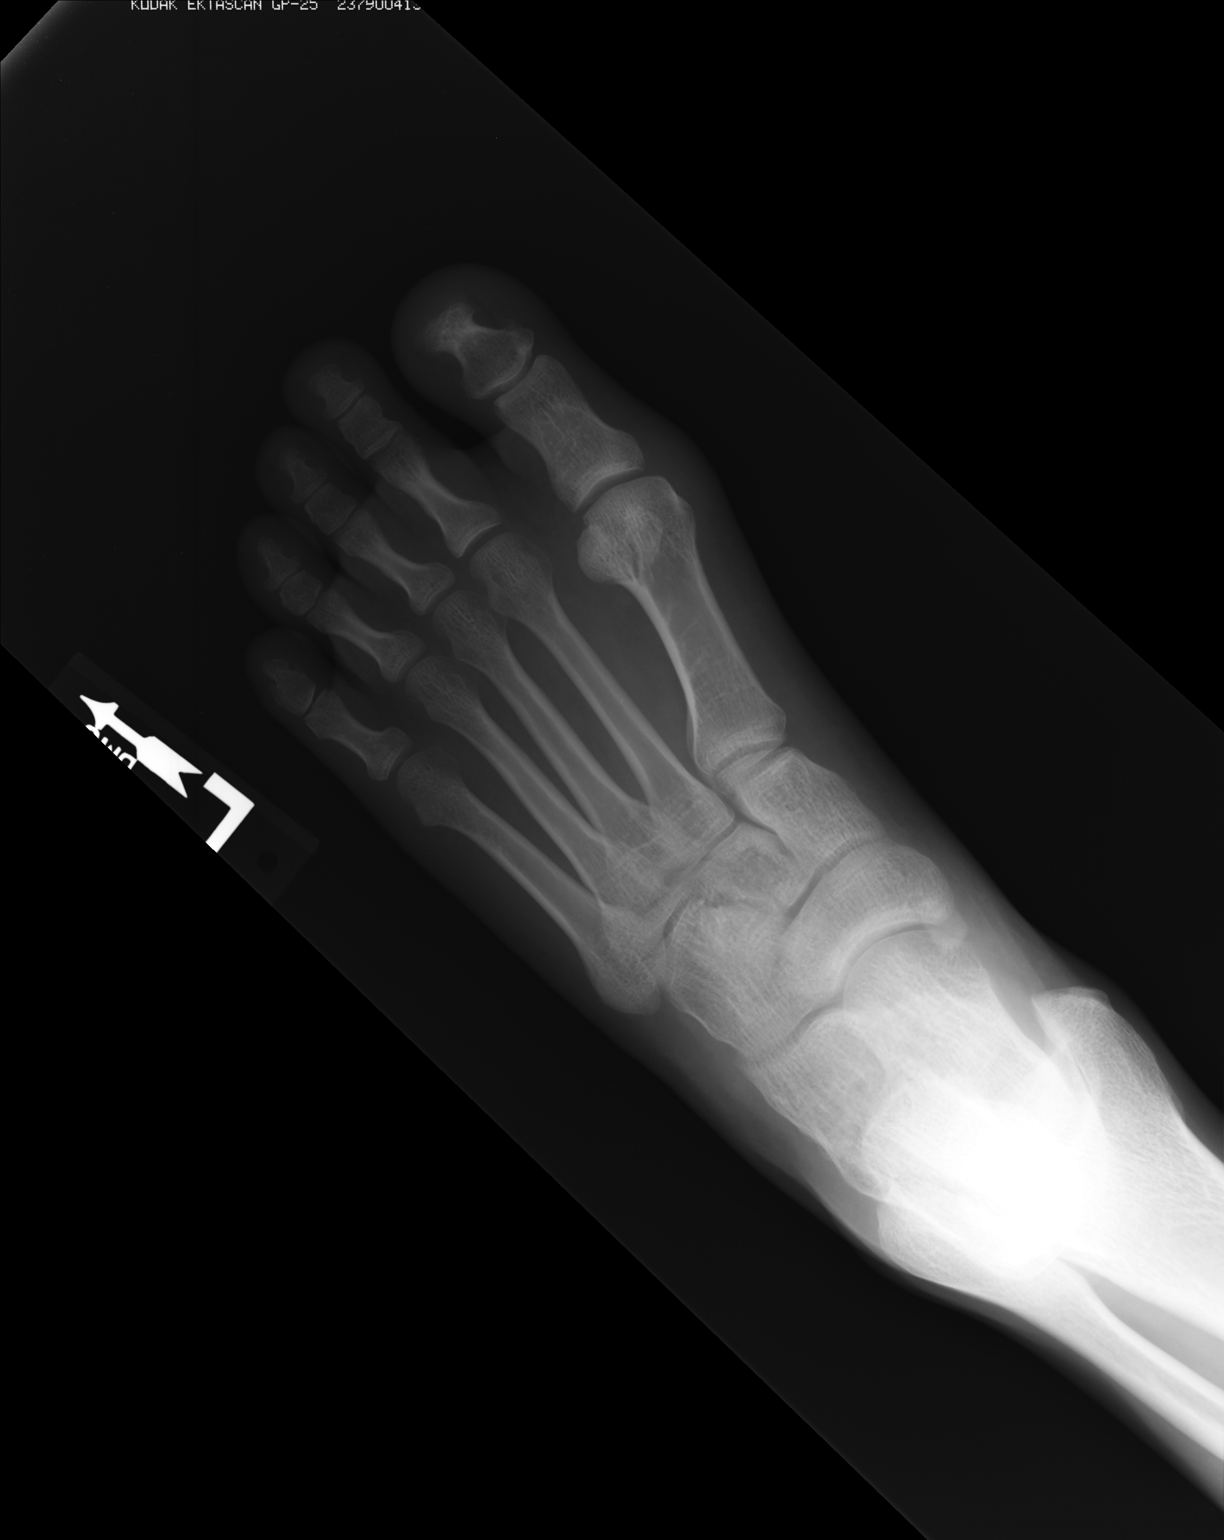

[lateral]
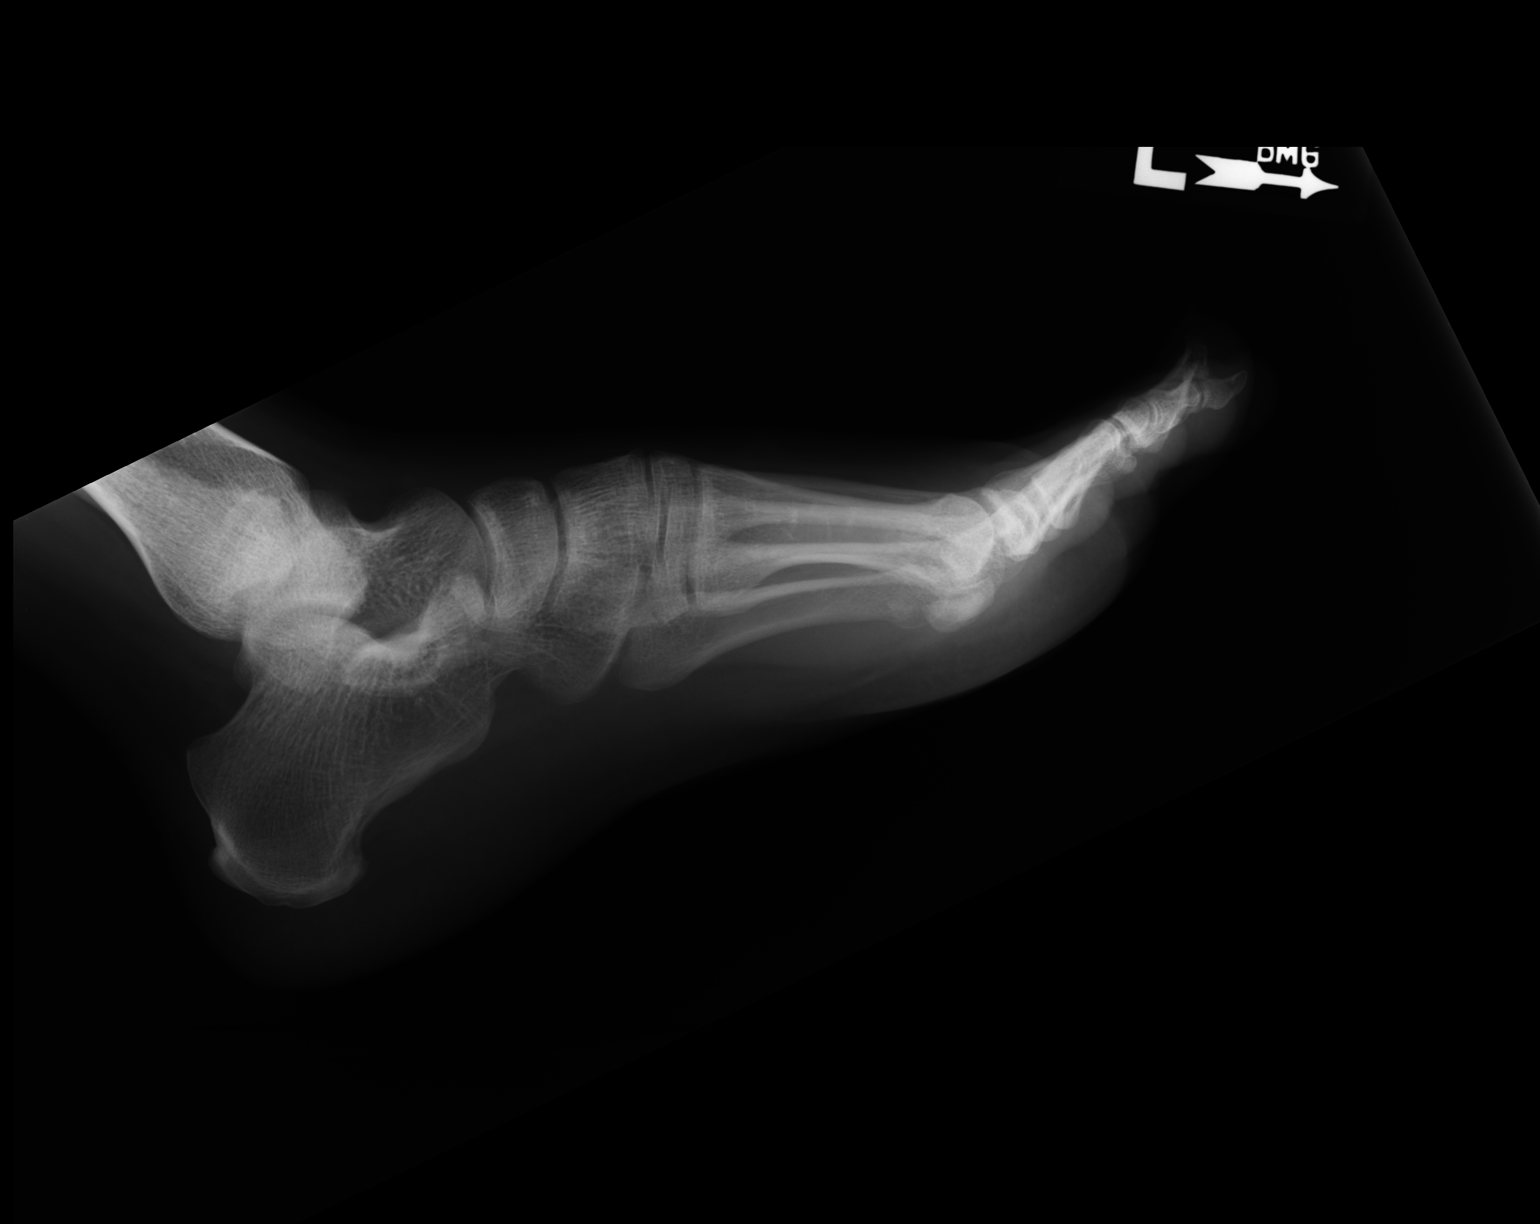

[2 of 2 positions shown; findings below may reference images not displayed]

FINDINGS: There is no evidence of fracture or dislocation. There is no
evidence of arthropathy or other focal bone abnormality. Soft
tissues are unremarkable.
IMPRESSION: Negative.

## 2017-04-14 ENCOUNTER — Encounter: Payer: Self-pay | Admitting: Physician Assistant

## 2017-04-14 ENCOUNTER — Ambulatory Visit: Payer: BLUE CROSS/BLUE SHIELD | Admitting: Physician Assistant

## 2017-04-14 VITALS — BP 108/78 | HR 73 | Temp 98.4°F | Resp 16 | Ht 62.0 in | Wt 137.0 lb

## 2017-04-14 DIAGNOSIS — Z118 Encounter for screening for other infectious and parasitic diseases: Secondary | ICD-10-CM | POA: Diagnosis not present

## 2017-04-14 DIAGNOSIS — Z130 Encounter for screening for diseases of the blood and blood-forming organs and certain disorders involving the immune mechanism: Secondary | ICD-10-CM

## 2017-04-14 DIAGNOSIS — Z1321 Encounter for screening for nutritional disorder: Secondary | ICD-10-CM | POA: Diagnosis not present

## 2017-04-14 DIAGNOSIS — R21 Rash and other nonspecific skin eruption: Secondary | ICD-10-CM

## 2017-04-14 DIAGNOSIS — Z13228 Encounter for screening for other metabolic disorders: Secondary | ICD-10-CM | POA: Diagnosis not present

## 2017-04-14 DIAGNOSIS — Z1329 Encounter for screening for other suspected endocrine disorder: Secondary | ICD-10-CM

## 2017-04-14 DIAGNOSIS — Z0000 Encounter for general adult medical examination without abnormal findings: Secondary | ICD-10-CM

## 2017-04-14 DIAGNOSIS — Z114 Encounter for screening for human immunodeficiency virus [HIV]: Secondary | ICD-10-CM

## 2017-04-14 LAB — POCT SKIN KOH: Skin KOH, POC: NEGATIVE

## 2017-04-14 MED ORDER — CLOTRIMAZOLE-BETAMETHASONE 1-0.05 % EX CREA: 1.0000 "application " | TOPICAL_CREAM | Freq: Two times a day (BID) | CUTANEOUS | 0 refills | Status: AC

## 2017-04-14 NOTE — Patient Instructions (Signed)
     IF you received an x-ray today, you will receive an invoice from [facility]. Please contact [facility] at [phone] with questions or concerns regarding your invoice.   IF you received labwork today, you will receive an invoice from [facility]. Please contact [facility] at [phone] with questions or concerns regarding your invoice.   Our billing staff will not be able to assist you with questions regarding bills from these companies.  You will be contacted with the lab results as soon as they are available. The fastest way to get your results is to activate your My Chart account. Instructions are located on the last page of this paperwork. If you have not heard from us regarding the results in 2 weeks, please contact this office.     

## 2017-04-14 NOTE — Progress Notes (Signed)
04/17/2017 9:14 AM   DOB: February 03, 1971 / MRN: [id number]  SUBJECTIVE:  Joshua Goodman is a 47 y.o. male presenting for annual physical exam.  He works in a [employer].  He does not exercise formally.  He tries to watch what he eats.  He does not complain of chest pain, shortness of breath, DOE, leg swelling, headache, neck pain, weakness.  He does complain of a rash on his right anterior forearm that has been present for about a month and is itchy.  Is not tried any medicine for this.  He feels like it is getting worse.  He has No Known Allergies.   He  has a past medical history of Hearing difficulty of left ear.    He  reports that he has quit smoking. He does not have any smokeless tobacco history on file. He reports that he drinks alcohol. He reports that he does not use drugs. He  has no sexual activity history on file. The patient  has a past surgical history that includes No past surgeries and Tympanoplasty (Left, 11/22/2013).  His family history includes Hypertension in his father.  Review of Systems  Constitutional: Negative for chills, diaphoresis and fever.  Eyes: Negative.   Respiratory: Negative for cough, hemoptysis, sputum production, shortness of breath and wheezing.   Cardiovascular: Negative for chest pain, orthopnea and leg swelling.  Gastrointestinal: Negative for abdominal pain, blood in stool, constipation, diarrhea, heartburn, melena, nausea and vomiting.  Genitourinary: Negative for dysuria, flank pain, frequency, hematuria and urgency.  Skin: Positive for itching and rash.  Neurological: Negative for dizziness, sensory change, speech change, focal weakness and headaches.    The problem list and medications were reviewed and updated by myself where necessary and exist elsewhere in the encounter.   OBJECTIVE:  BP 108/78   Pulse 73   Temp 98.4 F (36.9 C) (Oral)   Resp 16   Ht 5\' 2"  (1.575 m)   Wt 137 lb (62.1 kg)   SpO2 98%   BMI 25.06 kg/m   Physical  Exam  Constitutional: He appears well-developed. He is active and cooperative.  Non-toxic appearance.  Cardiovascular: Normal rate, regular rhythm, S1 normal, S2 normal, normal heart sounds, intact distal pulses and normal pulses. Exam reveals no gallop and no friction rub.  No murmur heard. Pulmonary/Chest: Effort normal. No stridor. No tachypnea. No respiratory distress. He has no wheezes. He has no rales.  Abdominal: He exhibits no distension.  Musculoskeletal: He exhibits no edema.       Arms: Neurological: He is alert.  Skin: Skin is warm and dry. He is not diaphoretic. No pallor.  Vitals reviewed.   Results for orders placed or performed in visit on 04/14/17 (from the past 72 hour(s))  POCT Skin KOH     Status: None   Collection Time: 04/14/17 10:24 AM  Result Value Ref Range   Skin KOH, POC Negative Negative  CBC     Status: Abnormal   Collection Time: 04/14/17 11:33 AM  Result Value Ref Range   WBC 6.0 3.4 - 10.8 x10E3/uL   RBC 5.86 (H) 4.14 - 5.80 x10E6/uL   Hemoglobin 16.6 13.0 - 17.7 g/dL   Hematocrit 11.9 14.7 - 51.0 %   MCV 83 79 - 97 fL   MCH 28.3 26.6 - 33.0 pg   MCHC 34.2 31.5 - 35.7 g/dL   RDW 82.9 56.2 - 13.0 %   Platelets 220 150 - 379 x10E3/uL  Lipid panel  Status: Abnormal   Collection Time: 04/14/17 11:33 AM  Result Value Ref Range   Cholesterol, Total 210 (H) 100 - 199 mg/dL   Triglycerides 147206 (H) 0 - 149 mg/dL   HDL 39 (L) >82>39 mg/dL   VLDL Cholesterol Cal 41 (H) 5 - 40 mg/dL   LDL Calculated 956130 (H) 0 - 99 mg/dL   Chol/HDL Ratio 5.4 (H) 0.0 - 5.0 ratio    Comment:                                   T. Chol/HDL Ratio                                             Men  Women                               1/2 Avg.Risk  3.4    3.3                                   Avg.Risk  5.0    4.4                                2X Avg.Risk  9.6    7.1                                3X Avg.Risk 23.4   11.0   TSH     Status: None   Collection Time: 04/14/17 11:33  AM  Result Value Ref Range   TSH 2.420 0.450 - 4.500 uIU/mL  Hemoglobin A1c     Status: None   Collection Time: 04/14/17 11:33 AM  Result Value Ref Range   Hgb A1c MFr Bld 5.4 4.8 - 5.6 %    Comment:          Prediabetes: 5.7 - 6.4          Diabetes: >6.4          Glycemic control for adults with diabetes: <7.0    Est. average glucose Bld gHb Est-mCnc 108 mg/dL  HIV antibody     Status: None   Collection Time: 04/14/17 11:33 AM  Result Value Ref Range   HIV Screen 4th Generation wRfx Non Reactive Non Reactive  Hepatitis C antibody     Status: None   Collection Time: 04/14/17 11:33 AM  Result Value Ref Range   Hep C Virus Ab <0.1 0.0 - 0.9 s/co ratio    Comment:                                   Negative:     < 0.8                              Indeterminate: 0.8 - 0.9  Positive:     > 0.9  The CDC recommends that a positive HCV antibody result  be followed up with a HCV Nucleic Acid Amplification  test (409811).   Hepatitis B surface antibody     Status: Abnormal   Collection Time: 04/14/17 11:33 AM  Result Value Ref Range   Hepatitis B Surf Ab Quant <3.1 (L) Immunity>9.9 mIU/mL    Comment:   Status of Immunity                     Anti-HBs Level   ------------------                     -------------- Inconsistent with Immunity                   0.0 - 9.9 Consistent with Immunity                          >9.9   Hepatitis B surface antigen     Status: None   Collection Time: 04/14/17 11:33 AM  Result Value Ref Range   Hepatitis B Surface Ag Negative Negative  Basic metabolic panel     Status: Abnormal   Collection Time: 04/14/17 11:33 AM  Result Value Ref Range   Glucose 104 (H) 65 - 99 mg/dL   BUN 13 6 - 24 mg/dL   Creatinine, Ser 9.14 0.76 - 1.27 mg/dL   GFR calc non Af Amer 105 >59 mL/min/1.73   GFR calc Af Amer 121 >59 mL/min/1.73   BUN/Creatinine Ratio 16 9 - 20   Sodium 139 134 - 144 mmol/L   Potassium 4.1 3.5 - 5.2 mmol/L    Chloride 102 96 - 106 mmol/L   CO2 20 20 - 29 mmol/L   Calcium 9.0 8.7 - 10.2 mg/dL  Hepatic function panel     Status: Abnormal   Collection Time: 04/14/17 11:33 AM  Result Value Ref Range   Total Protein 7.6 6.0 - 8.5 g/dL   Albumin 4.6 3.5 - 5.5 g/dL   Bilirubin Total 0.4 0.0 - 1.2 mg/dL   Bilirubin, Direct 7.82 0.00 - 0.40 mg/dL   Alkaline Phosphatase 68 39 - 117 IU/L   AST 28 0 - 40 IU/L   ALT 49 (H) 0 - 44 IU/L  GC/Chlamydia Probe Amp     Status: None   Collection Time: 04/14/17  5:42 PM  Result Value Ref Range   Chlamydia trachomatis, NAA Negative Negative   Neisseria gonorrhoeae by PCR Negative Negative    The 10-year ASCVD risk score Denman George DC Jr., et al., 2013) is: 3%   Values used to calculate the score:     Age: 27 years     Sex: Male     Is Non-Hispanic African American: No     Diabetic: No     Tobacco smoker: No     Systolic Blood Pressure: 108 mmHg     Is BP treated: No     HDL Cholesterol: 39 mg/dL     Total Cholesterol: 210 mg/dL   No results found.  ASSESSMENT AND PLAN:  Joshua Goodman was seen today for annual exam and bump on arm.  Diagnoses and all orders for this visit:  Rash and nonspecific skin eruption: Most likely ringworm.  Will treat to that effect -     POCT Skin KOH  Annual physical exam: ASCVD low.  We will recheck a physical in about  a year.  Screening for HIV (human immunodeficiency virus) -     HIV antibody  Screening for chlamydial disease -     GC/Chlamydia Probe Amp  Screening for endocrine, nutritional, metabolic and immunity disorder -     CBC -     Lipid panel -     TSH -     Hemoglobin A1c -     Hepatitis C antibody -     Hepatitis B surface antibody -     Hepatitis B surface antigen -     Basic metabolic panel -     Hepatic function panel    The patient is advised to call or return to clinic if he does not see an improvement in symptoms, or to seek the care of the closest emergency department if he worsens with the above  plan.   Deliah Boston, MHS, PA-C [facility] 04/17/2017 9:14 AM

## 2017-04-16 LAB — LIPID PANEL
CHOL/HDL RATIO: 5.4 ratio — AB (ref 0.0–5.0)
Cholesterol, Total: 210 mg/dL — ABNORMAL HIGH (ref 100–199)
HDL: 39 mg/dL — ABNORMAL LOW (ref >39)
LDL CALC: 130 mg/dL — AB (ref 0–99)
TRIGLYCERIDES: 206 mg/dL — AB (ref 0–149)
VLDL CHOLESTEROL CAL: 41 mg/dL — AB (ref 5–40)

## 2017-04-16 LAB — HEPATITIS B SURFACE ANTIBODY, QUANTITATIVE

## 2017-04-16 LAB — BASIC METABOLIC PANEL
BUN/Creatinine Ratio: 16 (ref 9–20)
BUN: 13 mg/dL (ref 6–24)
CALCIUM: 9 mg/dL (ref 8.7–10.2)
CHLORIDE: 102 mmol/L (ref 96–106)
CO2: 20 mmol/L (ref 20–29)
Creatinine, Ser: 0.82 mg/dL (ref 0.76–1.27)
GFR calc Af Amer: 121 mL/min/{1.73_m2} (ref >59)
GFR calc non Af Amer: 105 mL/min/{1.73_m2} (ref >59)
Glucose: 104 mg/dL — ABNORMAL HIGH (ref 65–99)
Potassium: 4.1 mmol/L (ref 3.5–5.2)
Sodium: 139 mmol/L (ref 134–144)

## 2017-04-16 LAB — CBC
HEMATOCRIT: 48.6 % (ref 37.5–51.0)
HEMOGLOBIN: 16.6 g/dL (ref 13.0–17.7)
MCH: 28.3 pg (ref 26.6–33.0)
MCHC: 34.2 g/dL (ref 31.5–35.7)
MCV: 83 fL (ref 79–97)
Platelets: 220 10*3/uL (ref 150–379)
RBC: 5.86 x10E6/uL — ABNORMAL HIGH (ref 4.14–5.80)
RDW: 13.2 % (ref 12.3–15.4)
WBC: 6 10*3/uL (ref 3.4–10.8)

## 2017-04-16 LAB — TSH: TSH: 2.42 u[IU]/mL (ref 0.450–4.500)

## 2017-04-16 LAB — HEPATIC FUNCTION PANEL
ALT: 49 IU/L — ABNORMAL HIGH (ref 0–44)
AST: 28 IU/L (ref 0–40)
Albumin: 4.6 g/dL (ref 3.5–5.5)
Alkaline Phosphatase: 68 IU/L (ref 39–117)
BILIRUBIN TOTAL: 0.4 mg/dL (ref 0.0–1.2)
Bilirubin, Direct: 0.09 mg/dL (ref 0.00–0.40)
Total Protein: 7.6 g/dL (ref 6.0–8.5)

## 2017-04-16 LAB — HEPATITIS B SURFACE ANTIGEN: Hepatitis B Surface Ag: NEGATIVE

## 2017-04-16 LAB — GC/CHLAMYDIA PROBE AMP
CHLAMYDIA, DNA PROBE: NEGATIVE
Neisseria gonorrhoeae by PCR: NEGATIVE

## 2017-04-16 LAB — HEMOGLOBIN A1C
Est. average glucose Bld gHb Est-mCnc: 108 mg/dL
Hgb A1c MFr Bld: 5.4 % (ref 4.8–5.6)

## 2017-04-16 LAB — HEPATITIS C ANTIBODY: Hep C Virus Ab: <0.1 s/co ratio (ref 0.0–0.9)

## 2017-04-16 LAB — HIV ANTIBODY (ROUTINE TESTING W REFLEX): HIV Screen 4th Generation wRfx: NONREACTIVE

## 2017-04-23 ENCOUNTER — Encounter: Payer: Self-pay | Admitting: Radiology

## 2017-05-12 ENCOUNTER — Ambulatory Visit: Payer: BLUE CROSS/BLUE SHIELD | Admitting: Physician Assistant

## 2017-05-12 VITALS — BP 137/89 | HR 81 | Temp 98.7°F | Resp 16 | Ht 62.0 in | Wt 138.0 lb

## 2017-05-12 DIAGNOSIS — K219 Gastro-esophageal reflux disease without esophagitis: Secondary | ICD-10-CM | POA: Diagnosis not present

## 2017-05-12 MED ORDER — PANTOPRAZOLE SODIUM 40 MG PO TBEC: 40.0000 mg | DELAYED_RELEASE_TABLET | Freq: Every day | ORAL | 0 refills | Status: DC

## 2017-05-12 NOTE — Addendum Note (Signed)
Addended by: Ofilia NeasLARK, Keilynn Marano L on: 05/12/2017 10:01 AM   Modules accepted: Orders

## 2017-05-12 NOTE — Progress Notes (Signed)
05/12/2017 9:59 AM   DOB: October 31, 1970 / MRN: [id number]  SUBJECTIVE:  Joshua Goodman is a 47 y.o. male presenting to discuss previous lab work.  ASCVD score 4.5%.  No significant abnormalities found on his labs.  Patient is happy to hear this.  Patient complains of pain in the left upper quadrant that occurs with eating despite food choices.  Patient describes the pain as a cramp/burn and moderate.  Pain subsides 30 minutes to an hour and a half after eating.  He is never tried any medicine.  This is been going on for 5 years.  He denies blood in the stool.  He has No Known Allergies.   He  has a past medical history of Hearing difficulty of left ear.    He  reports that he has quit smoking. He does not have any smokeless tobacco history on file. He reports that he drinks alcohol. He reports that he does not use drugs. He  has no sexual activity history on file. The patient  has a past surgical history that includes No past surgeries and Tympanoplasty (Left, 11/22/2013).  His family history includes Hypertension in his father.  Review of Systems  Constitutional: Negative for chills, diaphoresis and fever.  Eyes: Negative.   Respiratory: Negative for cough, hemoptysis, sputum production, shortness of breath and wheezing.   Cardiovascular: Negative for chest pain, orthopnea and leg swelling.  Gastrointestinal: Positive for abdominal pain. Negative for blood in stool, constipation, diarrhea, heartburn, melena, nausea and vomiting.  Genitourinary: Negative for flank pain.  Skin: Negative for rash.  Neurological: Negative for dizziness, sensory change, speech change, focal weakness and headaches.    The problem list and medications were reviewed and updated by myself where necessary and exist elsewhere in the encounter.   OBJECTIVE:  BP 137/89   Pulse 81   Temp 98.7 F (37.1 C) (Oral)   Resp 16   Ht 5\' 2"  (1.575 m)   Wt 138 lb (62.6 kg)   SpO2 97%   BMI 25.24 kg/m   Wt Readings from  Last 3 Encounters:  05/12/17 138 lb (62.6 kg)  04/14/17 137 lb (62.1 kg)  11/22/13 125 lb 8 oz (56.9 kg)   Temp Readings from Last 3 Encounters:  05/12/17 98.7 F (37.1 C) (Oral)  04/14/17 98.4 F (36.9 C) (Oral)  11/22/13 98.6 F (37 C)   BP Readings from Last 3 Encounters:  05/12/17 137/89  04/14/17 108/78  11/22/13 120/82   Pulse Readings from Last 3 Encounters:  05/12/17 81  04/14/17 73  11/22/13 83     Physical Exam  Constitutional: He appears well-developed. He is active and cooperative.  Non-toxic appearance.  Cardiovascular: Normal rate, regular rhythm, S1 normal, S2 normal, normal heart sounds, intact distal pulses and normal pulses. Exam reveals no gallop and no friction rub.  No murmur heard. Pulmonary/Chest: Effort normal. No stridor. No tachypnea. No respiratory distress. He has no wheezes. He has no rales.  Abdominal: Soft. Normal appearance and bowel sounds are normal. He exhibits no distension and no mass. There is no tenderness. There is no rigidity, no rebound, no guarding and no CVA tenderness. No hernia.  Musculoskeletal: He exhibits no edema.  Neurological: He is alert.  Skin: Skin is warm and dry. He is not diaphoretic. No pallor.  Vitals reviewed.   No results found for this or any previous visit (from the past 72 hour(s)).  No results found.  ASSESSMENT AND PLAN:  [name] was seen today  for rash, foot injury and flank pain.  Diagnoses and all orders for this visit:  GERD without esophagitis: No red flags on HPI or exam.  Patient is a non-smoker.  We will treat to reduce his symptoms for now and will treat if H. pylori is positive.  RTC in 60 days. -     H. pylori breath test -     pantoprazole (PROTONIX) 40 MG tablet; Take 1 tablet (40 mg total) by mouth daily. Take 30 minutes before the first meal of the day and then eat.    The patient is advised to call or return to clinic if he does not see an improvement in symptoms, or to seek the care  of the closest emergency department if he worsens with the above plan.   Deliah BostonMichael Clark, MHS, PA-C Primary Care at [facility] 05/12/2017 9:59 AM

## 2017-05-12 NOTE — Patient Instructions (Signed)
     IF you received an x-ray today, you will receive an invoice from [facility]. Please contact [facility] at [phone] with questions or concerns regarding your invoice.   IF you received labwork today, you will receive an invoice from [facility]. Please contact [facility] at [phone] with questions or concerns regarding your invoice.   Our billing staff will not be able to assist you with questions regarding bills from these companies.  You will be contacted with the lab results as soon as they are available. The fastest way to get your results is to activate your My Chart account. Instructions are located on the last page of this paperwork. If you have not heard from us regarding the results in 2 weeks, please contact this office.     

## 2017-05-13 LAB — H. PYLORI BREATH COLLECTION

## 2017-05-13 LAB — H. PYLORI BREATH TEST: H pylori Breath Test: POSITIVE — AB

## 2017-05-14 ENCOUNTER — Other Ambulatory Visit: Payer: Self-pay | Admitting: Physician Assistant

## 2017-05-14 MED ORDER — CLARITHROMYCIN 500 MG PO TABS: 500.0000 mg | ORAL_TABLET | Freq: Two times a day (BID) | ORAL | 0 refills | Status: DC

## 2017-05-14 MED ORDER — AMOXICILLIN 500 MG PO CAPS: 1000.0000 mg | ORAL_CAPSULE | Freq: Two times a day (BID) | ORAL | 0 refills | Status: DC

## 2017-05-14 NOTE — Progress Notes (Signed)
H. Pylori positive.  Will treat with triple therapy. Patient notified by clinical staff. Deliah BostonMichael Clark, MS, PA-C 5:41 AM, 05/14/2017

## 2017-06-17 ENCOUNTER — Telehealth: Payer: Self-pay | Admitting: Physician Assistant

## 2017-06-17 NOTE — Telephone Encounter (Signed)
Called and rescheduled pt's appt from 07/14/17 to 07/21/17 at 9:20 am. Advised him of building number, time and late policy.

## 2017-07-14 ENCOUNTER — Ambulatory Visit: Payer: BLUE CROSS/BLUE SHIELD | Admitting: Physician Assistant

## 2017-07-21 ENCOUNTER — Encounter: Payer: Self-pay | Admitting: Physician Assistant

## 2017-07-21 ENCOUNTER — Other Ambulatory Visit: Payer: Self-pay

## 2017-07-21 ENCOUNTER — Ambulatory Visit: Payer: BLUE CROSS/BLUE SHIELD | Admitting: Physician Assistant

## 2017-07-21 VITALS — BP 121/80 | HR 100 | Temp 98.5°F | Ht 63.58 in | Wt 137.0 lb

## 2017-07-21 DIAGNOSIS — A048 Other specified bacterial intestinal infections: Secondary | ICD-10-CM | POA: Diagnosis not present

## 2017-07-21 NOTE — Progress Notes (Signed)
    07/21/2017 9:47 AM   DOB: 1970-09-08 / MRN: [id number]  SUBJECTIVE:  Joshua Goodman is a 47 y.o. male presenting for recheck H. pylori.  Patient tells me he completed medications that included amoxicillin and Biaxin as well as pantoprazole.  Tells me his heartburn is completely gone at this time.  He denies blood in the stool, nausea, fever.  He has No Known Allergies.   He  has a past medical history of Hearing difficulty of left ear.    He  reports that he has quit smoking. He has never used smokeless tobacco. He reports that he drinks alcohol. He reports that he does not use drugs. He  has no sexual activity history on file. The patient  has a past surgical history that includes No past surgeries and Tympanoplasty (Left, 11/22/2013).  His family history includes Hypertension in his father.  ROS per HPI  The problem list and medications were reviewed and updated by myself where necessary and exist elsewhere in the encounter.   OBJECTIVE:  BP 121/80 (BP Location: Left Arm, Patient Position: Sitting, Cuff Size: Normal)   Pulse 100   Temp 98.5 F (36.9 C) (Oral)   Ht 5' 3.58" (1.615 m)   Wt 137 lb (62.1 kg)   SpO2 94%   BMI 23.83 kg/m   Wt Readings from Last 3 Encounters:  07/21/17 137 lb (62.1 kg)  05/12/17 138 lb (62.6 kg)  04/14/17 137 lb (62.1 kg)   Temp Readings from Last 3 Encounters:  07/21/17 98.5 F (36.9 C) (Oral)  05/12/17 98.7 F (37.1 C) (Oral)  04/14/17 98.4 F (36.9 C) (Oral)   BP Readings from Last 3 Encounters:  07/21/17 121/80  05/12/17 137/89  04/14/17 108/78   Pulse Readings from Last 3 Encounters:  07/21/17 100  05/12/17 81  04/14/17 73     Physical Exam  Constitutional: He is oriented to person, place, and time. He appears well-developed. He does not appear ill.  Eyes: Pupils are equal, round, and reactive to light. Conjunctivae and EOM are normal.  Cardiovascular: Normal rate.  Pulmonary/Chest: Effort normal.  Abdominal: Soft. Bowel  sounds are normal. He exhibits no distension and no mass. There is no tenderness. There is no rebound and no guarding. No hernia.  Musculoskeletal: Normal range of motion.  Neurological: He is alert and oriented to person, place, and time. No cranial nerve deficit. Coordination normal.  Skin: Skin is warm and dry. He is not diaphoretic.  Psychiatric: He has a normal mood and affect.  Nursing note and vitals reviewed.     No results found for this or any previous visit (from the past 72 hour(s)).  No results found.  ASSESSMENT AND PLAN:  Joshua Goodman was seen today for gastroesophageal reflux.  Diagnoses and all orders for this visit:  H. pylori infection Comments: Resolved.  Abdominal exam negative.  Patient with no other needs at this time.    The patient is advised to call or return to clinic if he does not see an improvement in symptoms, or to seek the care of the closest emergency department if he worsens with the above plan.   Deliah BostonMichael Russel Morain, MHS, PA-C Primary Care at [facility] 07/21/2017 9:47 AM

## 2017-07-21 NOTE — Patient Instructions (Signed)
     IF you received an x-ray today, you will receive an invoice from [facility]. Please contact [facility] at [phone] with questions or concerns regarding your invoice.   IF you received labwork today, you will receive an invoice from [facility]. Please contact [facility] at [phone] with questions or concerns regarding your invoice.   Our billing staff will not be able to assist you with questions regarding bills from these companies.  You will be contacted with the lab results as soon as they are available. The fastest way to get your results is to activate your My Chart account. Instructions are located on the last page of this paperwork. If you have not heard from us regarding the results in 2 weeks, please contact this office.     

## 2017-09-09 ENCOUNTER — Other Ambulatory Visit: Payer: Self-pay

## 2017-09-09 ENCOUNTER — Ambulatory Visit (INDEPENDENT_AMBULATORY_CARE_PROVIDER_SITE_OTHER): Payer: BLUE CROSS/BLUE SHIELD

## 2017-09-09 ENCOUNTER — Ambulatory Visit: Payer: BLUE CROSS/BLUE SHIELD | Admitting: Emergency Medicine

## 2017-09-09 ENCOUNTER — Encounter: Payer: Self-pay | Admitting: Emergency Medicine

## 2017-09-09 VITALS — BP 134/86 | HR 82 | Temp 98.4°F | Resp 16 | Ht 62.25 in | Wt 135.8 lb

## 2017-09-09 DIAGNOSIS — M545 Low back pain, unspecified: Secondary | ICD-10-CM | POA: Insufficient documentation

## 2017-09-09 DIAGNOSIS — S39012A Strain of muscle, fascia and tendon of lower back, initial encounter: Secondary | ICD-10-CM | POA: Diagnosis not present

## 2017-09-09 MED ORDER — DICLOFENAC SODIUM 75 MG PO TBEC: 75.0000 mg | DELAYED_RELEASE_TABLET | Freq: Two times a day (BID) | ORAL | 0 refills | Status: AC
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
## 2017-09-09 NOTE — Patient Instructions (Addendum)
     IF you received an x-ray today, you will receive an invoice from [facility]. Please contact [facility] at [phone] with questions or concerns regarding your invoice.   IF you received labwork today, you will receive an invoice from [facility]. Please contact [facility] at [phone] with questions or concerns regarding your invoice.   Our billing staff will not be able to assist you with questions regarding bills from these companies.  You will be contacted with the lab results as soon as they are available. The fastest way to get your results is to activate your My Chart account. Instructions are located on the last page of this paperwork. If you have not heard from us regarding the results in 2 weeks, please contact this office.      Back Pain, Adult Back pain is very common. The pain often gets better over time. The cause of back pain is usually not dangerous. Most people can learn to manage their back pain on their own. Follow these instructions at home: Watch your back pain for any changes. The following actions may help to lessen any pain you are feeling:  Stay active. Start with short walks on flat ground if you can. Try to walk farther each day.  Exercise regularly as told by your doctor. Exercise helps your back heal faster. It also helps avoid future injury by keeping your muscles strong and flexible.  Do not sit, drive, or stand in one place for more than 30 minutes.  Do not stay in bed. Resting more than 1-2 days can slow down your recovery.  Be careful when you bend or lift an object. Use good form when lifting: ? Bend at your knees. ? Keep the object close to your body. ? Do not twist.  Sleep on a firm mattress. Lie on your side, and bend your knees. If you lie on your back, put a pillow under your knees.  Take medicines only as told by your doctor.  Put ice on the injured area. ? Put ice in a plastic bag. ? Place a towel between your  skin and the bag. ? Leave the ice on for 20 minutes, 2-3 times a day for the first 2-3 days. After that, you can switch between ice and heat packs.  Avoid feeling anxious or stressed. Find good ways to deal with stress, such as exercise.  Maintain a healthy weight. Extra weight puts stress on your back.  Contact a doctor if:  You have pain that does not go away with rest or medicine.  You have worsening pain that goes down into your legs or buttocks.  You have pain that does not get better in one week.  You have pain at night.  You lose weight.  You have a fever or chills. Get help right away if:  You cannot control when you poop (bowel movement) or pee (urinate).  Your arms or legs feel weak.  Your arms or legs lose feeling (numbness).  You feel sick to your stomach (nauseous) or throw up (vomit).  You have belly (abdominal) pain.  You feel like you may pass out (faint). This information is not intended to replace advice given to you by your health care provider. Make sure you discuss any questions you have with your health care provider. Document Released: 07/10/2007 Document Revised: 06/29/2015 Document Reviewed: 05/25/2013 Elsevier Interactive Patient Education  2018 Elsevier Inc.  

## 2019-04-28 ENCOUNTER — Encounter: Payer: Self-pay | Admitting: Emergency Medicine

## 2019-04-28 ENCOUNTER — Telehealth (INDEPENDENT_AMBULATORY_CARE_PROVIDER_SITE_OTHER): Payer: BLUE CROSS/BLUE SHIELD | Admitting: Emergency Medicine

## 2019-04-28 ENCOUNTER — Other Ambulatory Visit: Payer: Self-pay

## 2019-04-28 DIAGNOSIS — R05 Cough: Secondary | ICD-10-CM | POA: Diagnosis not present

## 2019-04-28 DIAGNOSIS — U071 COVID-19: Secondary | ICD-10-CM | POA: Diagnosis not present

## 2019-04-28 DIAGNOSIS — R059 Cough, unspecified: Secondary | ICD-10-CM

## 2019-04-28 NOTE — Progress Notes (Signed)
Telemedicine Encounter- SOAP NOTE Established Patient MyChart video conference attempted without success This telephone encounter was conducted with the patient's (or proxy's) verbal consent via audio telecommunications: yes/no: Yes Patient was instructed to have this encounter in a suitably private space; and to only have persons present to whom they give permission to participate. In addition, patient identity was confirmed by use of name plus two identifiers (DOB and address).  I discussed the limitations, risks, security and privacy concerns of performing an evaluation and management service by telephone and the availability of in person appointments. I also discussed with the patient that there may be a patient responsible charge related to this service. The patient expressed understanding and agreed to proceed.  I spent a total of TIME; 0 MIN TO 60 MIN: 15 minutes talking with the patient or their proxy.  No chief complaint on file.   Subjective   Joshua Goodman is a 49 y.o. male established patient. Telephone visit today for follow-up on Covid infection. Still coughing but overall getting better.  No complications.  HPI   There are no problems to display for this patient.   Past Medical History:  Diagnosis Date  . Hearing difficulty of left ear     Current Outpatient Medications  Medication Sig Dispense Refill  . clotrimazole-betamethasone (LOTRISONE) cream Apply 1 application topically 2 (two) times daily. (Patient not taking: Reported on 04/28/2019) 30 g 0   No current facility-administered medications for this visit.    No Known Allergies  Social History   Socioeconomic History  . Marital status: Single    Spouse name: Not on file  . Number of children: Not on file  . Years of education: Not on file  . Highest education level: Not on file  Occupational History  . Not on file  Tobacco Use  . Smoking status: Former Games developer  . Smokeless tobacco: Never Used   Substance and Sexual Activity  . Alcohol use: Yes    Comment: 2 times yearly  . Drug use: No  . Sexual activity: Not on file  Other Topics Concern  . Not on file  Social History Narrative  . Not on file   Social Determinants of Health   Financial Resource Strain:   . Difficulty of Paying Living Expenses:   Food Insecurity:   . Worried About Programme researcher, broadcasting/film/video in the Last Year:   . Barista in the Last Year:   Transportation Needs:   . Freight forwarder (Medical):   Marland Kitchen Lack of Transportation (Non-Medical):   Physical Activity:   . Days of Exercise per Week:   . Minutes of Exercise per Session:   Stress:   . Feeling of Stress :   Social Connections:   . Frequency of Communication with Friends and Family:   . Frequency of Social Gatherings with Friends and Family:   . Attends Religious Services:   . Active Member of Clubs or Organizations:   . Attends Banker Meetings:   Marland Kitchen Marital Status:   Intimate Partner Violence:   . Fear of Current or Ex-Partner:   . Emotionally Abused:   Marland Kitchen Physically Abused:   . Sexually Abused:     Review of Systems  Constitutional: Negative.  Negative for chills and fever.  HENT: Negative.  Negative for congestion and sore throat.   Respiratory: Positive for cough. Negative for shortness of breath and wheezing.   Cardiovascular: Negative.  Negative for chest pain and palpitations.  Gastrointestinal: Negative for abdominal pain, diarrhea, nausea and vomiting.  Genitourinary: Negative.  Negative for hematuria.  Musculoskeletal: Negative for back pain, myalgias and neck pain.  Skin: Negative.  Negative for rash.  Neurological: Negative.  Negative for dizziness and headaches.  All other systems reviewed and are negative.   Objective  Alert and oriented x3 in no apparent respiratory distress Vitals as reported by the patient: There were no vitals filed for this visit.  There are no diagnoses linked to this encounter.  Diagnoses and all orders for this visit:  COVID-19 virus infection  Cough  Clinically stable.  No Covid complications.  ED precautions given. Advised to contact the office if no better or worse during the next several days.   I discussed the assessment and treatment plan with the patient. The patient was provided an opportunity to ask questions and all were answered. The patient agreed with the plan and demonstrated an understanding of the instructions.   The patient was advised to call back or seek an in-person evaluation if the symptoms worsen or if the condition fails to improve as anticipated.  I provided 15 minutes of non-face-to-face time during this encounter.  Horald Pollen, MD  Primary Care at [facility]

## 2019-04-28 NOTE — Progress Notes (Signed)
Post-Covid Cough Sometime hurt when cough  3 weeks    finished isolation 5 days ago.

## 2019-04-29 ENCOUNTER — Telehealth: Payer: BLUE CROSS/BLUE SHIELD | Admitting: Emergency Medicine

## 2019-08-11 IMAGING — DX DG LUMBAR SPINE 2-3V
3 series · 3 of 3 positions shown · non-contrast
Comparison: None.

CLINICAL DATA: Low back pain.

EXAM:
LUMBAR SPINE - 2-3 VIEW

[l-spine ap]
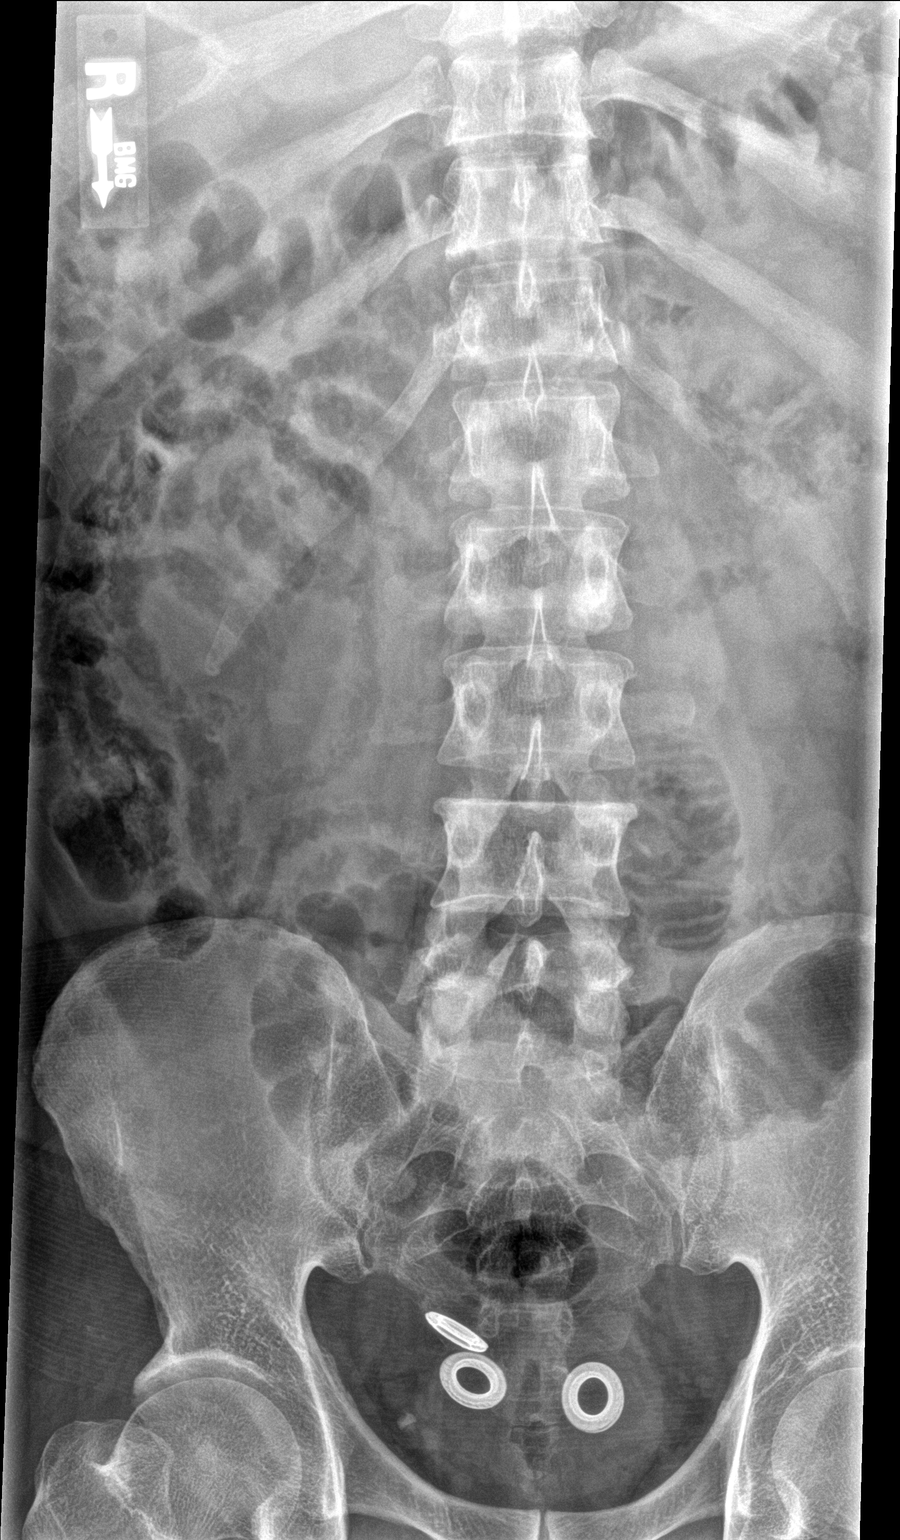

[l-spine lat]
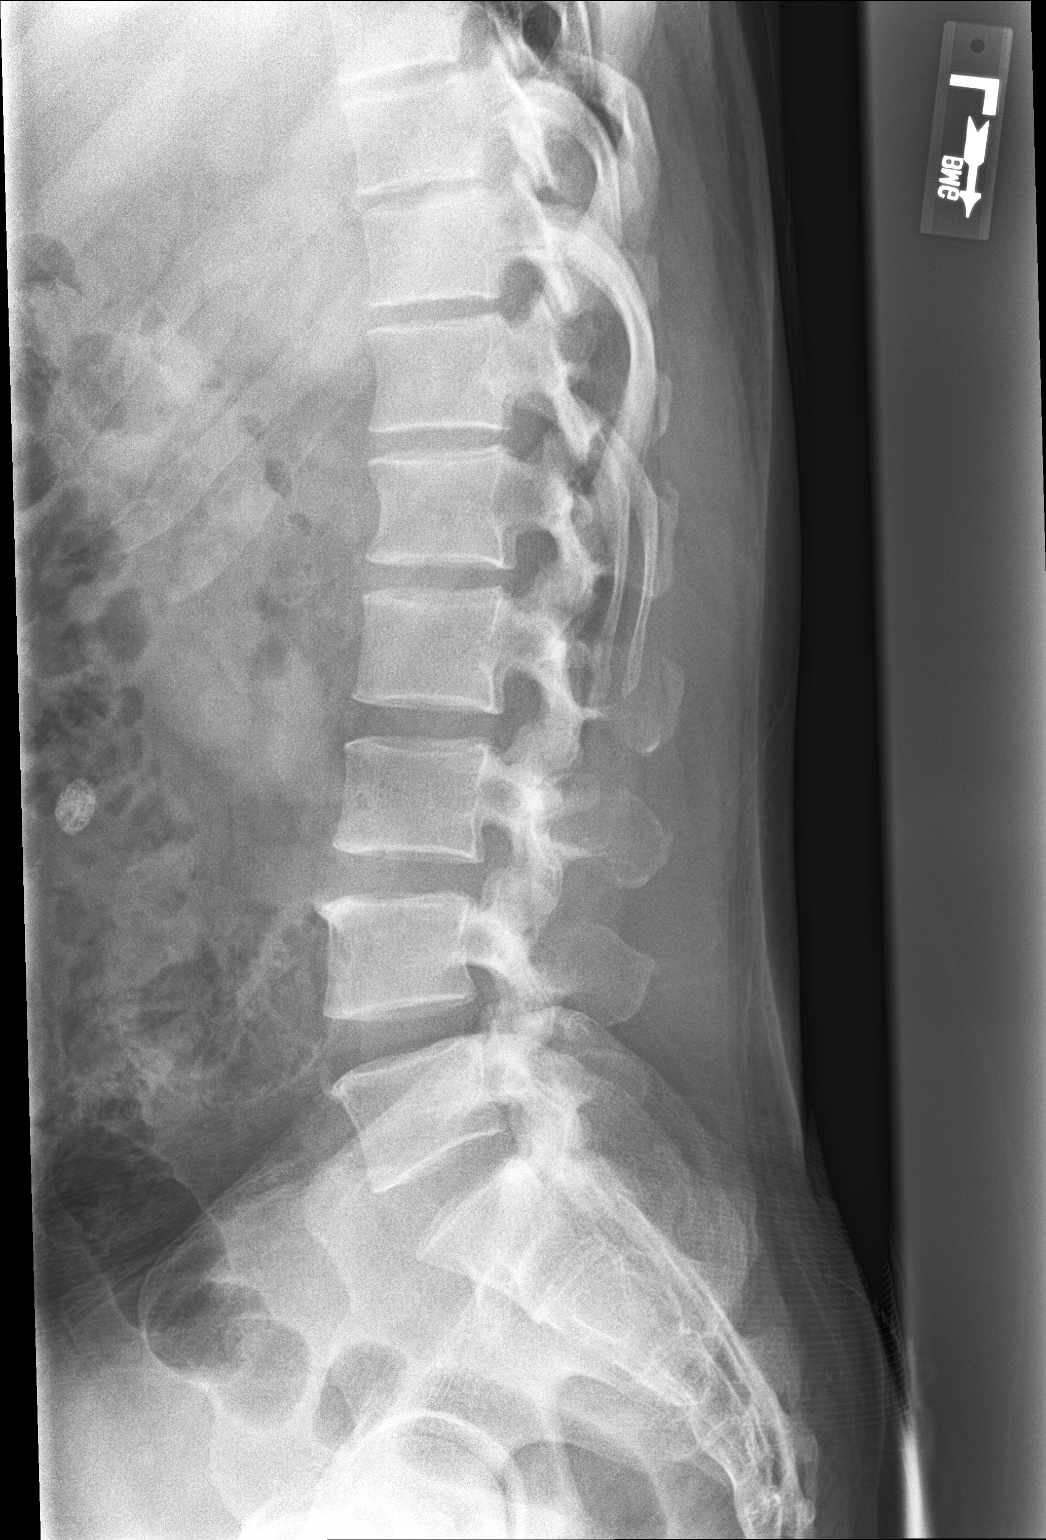

[l-spine l5-s1]
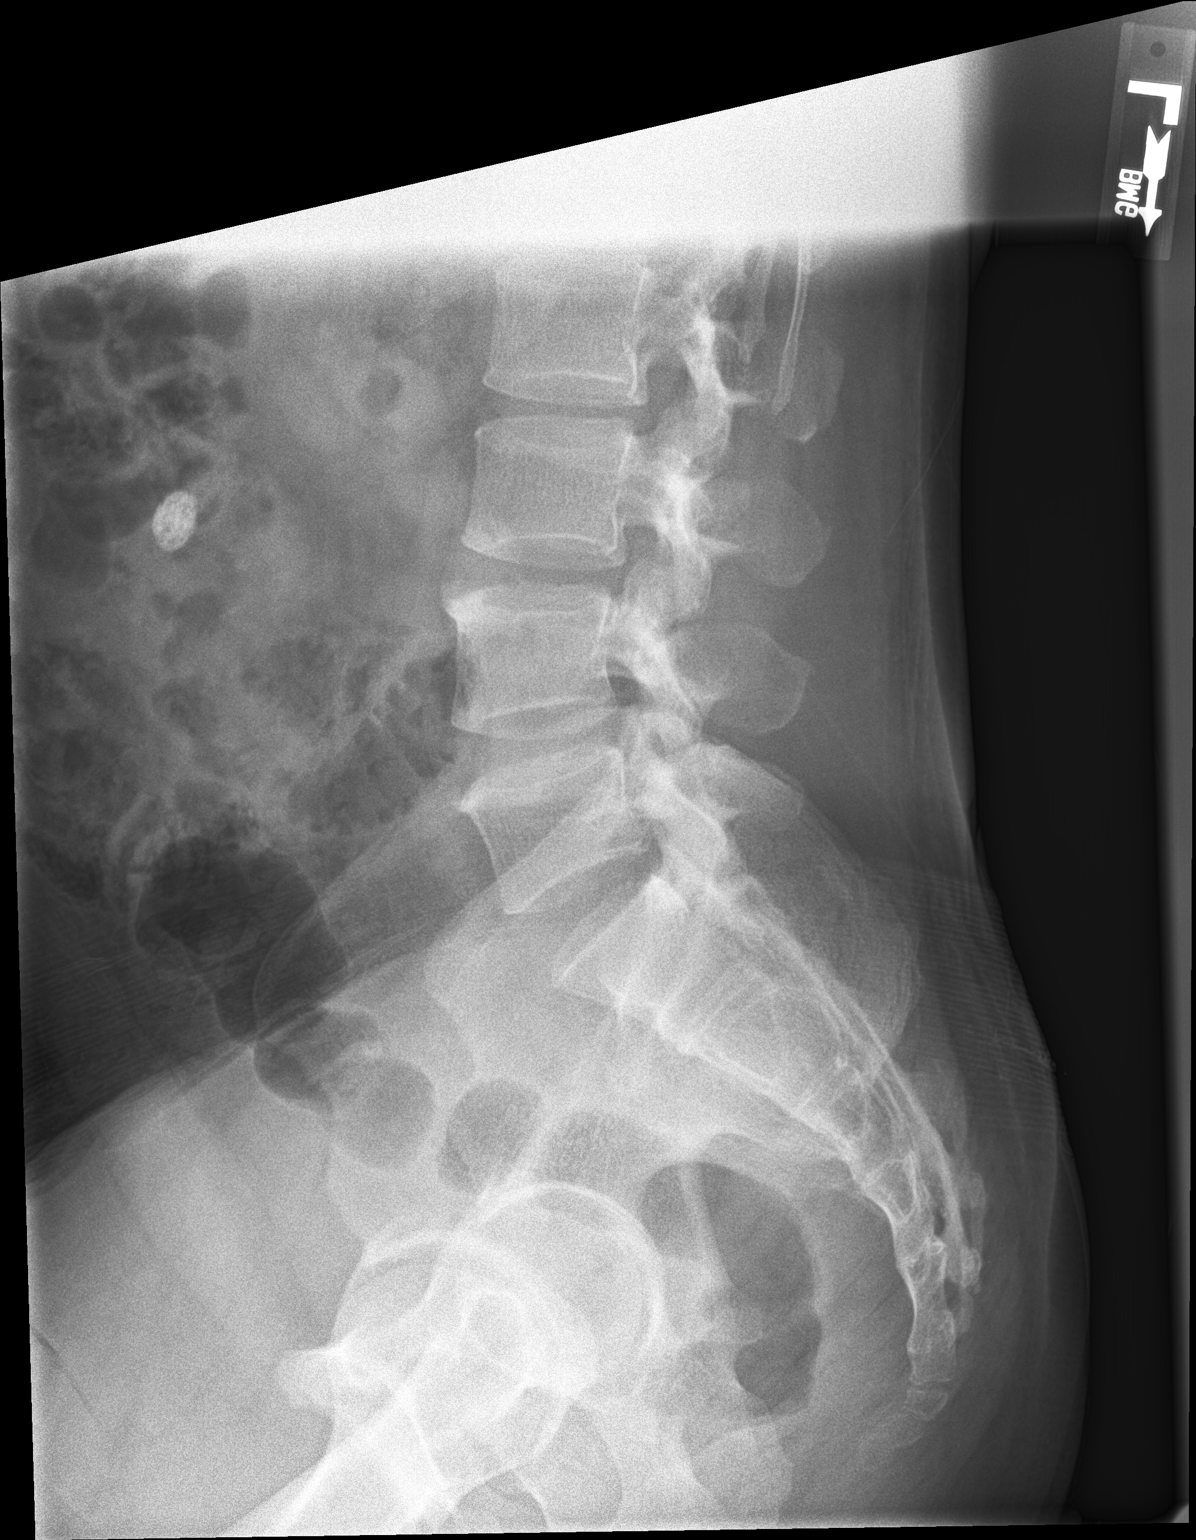

[3 of 3 positions shown; findings below may reference images not displayed]

FINDINGS: Vertebral body alignment, heights and disc space heights are normal.
There is minimal spondylosis of the lumbar spine to include facet
arthropathy. There is no evidence of compression fracture or
subluxation. Remainder of the exam is unremarkable.
IMPRESSION: No acute findings.

Mild spondylosis of the lumbar spine.
